# Patient Record
Sex: Female | Born: 1995 | Race: White | Hispanic: No | Marital: Married | Smoking: Former smoker
Health system: Southern US, Community
[De-identification: ages and names within clinical notes are randomized; demographics above are authoritative.]

## PROBLEM LIST (undated history)

## (undated) DIAGNOSIS — F32A Depression, unspecified: Secondary | ICD-10-CM

## (undated) DIAGNOSIS — F419 Anxiety disorder, unspecified: Secondary | ICD-10-CM

## (undated) DIAGNOSIS — Z789 Other specified health status: Secondary | ICD-10-CM

## (undated) DIAGNOSIS — K219 Gastro-esophageal reflux disease without esophagitis: Secondary | ICD-10-CM

## (undated) DIAGNOSIS — J45909 Unspecified asthma, uncomplicated: Secondary | ICD-10-CM

## (undated) DIAGNOSIS — F329 Major depressive disorder, single episode, unspecified: Secondary | ICD-10-CM

## (undated) DIAGNOSIS — F640 Transsexualism: Secondary | ICD-10-CM

## (undated) DIAGNOSIS — H8110 Benign paroxysmal vertigo, unspecified ear: Secondary | ICD-10-CM

## (undated) DIAGNOSIS — G43909 Migraine, unspecified, not intractable, without status migrainosus: Secondary | ICD-10-CM

## (undated) HISTORY — DX: Major depressive disorder, single episode, unspecified: F32.9

## (undated) HISTORY — DX: Transsexualism: F64.0

## (undated) HISTORY — PX: TONSILLECTOMY: SUR1361

## (undated) HISTORY — DX: Migraine, unspecified, not intractable, without status migrainosus: G43.909

## (undated) HISTORY — DX: Depression, unspecified: F32.A

## (undated) HISTORY — DX: Other specified health status: Z78.9

## (undated) HISTORY — DX: Benign paroxysmal vertigo, unspecified ear: H81.10

## (undated) HISTORY — DX: Anxiety disorder, unspecified: F41.9

## (undated) HISTORY — PX: ADENOIDECTOMY: SUR15

---

## 2015-05-20 ENCOUNTER — Encounter: Payer: Self-pay | Admitting: Endocrinology

## 2015-05-20 ENCOUNTER — Ambulatory Visit (INDEPENDENT_AMBULATORY_CARE_PROVIDER_SITE_OTHER): Payer: BLUE CROSS/BLUE SHIELD | Admitting: Endocrinology

## 2015-05-20 VITALS — BP 128/88 | HR 74 | Temp 97.7°F | Ht 71.5 in | Wt 259.0 lb

## 2015-05-20 DIAGNOSIS — R635 Abnormal weight gain: Secondary | ICD-10-CM | POA: Diagnosis not present

## 2015-05-20 DIAGNOSIS — Z789 Other specified health status: Secondary | ICD-10-CM | POA: Insufficient documentation

## 2015-05-20 DIAGNOSIS — F640 Transsexualism: Secondary | ICD-10-CM | POA: Diagnosis not present

## 2015-05-20 MED ORDER — ESTRADIOL 0.5 MG PO TABS: 0.5000 mg | ORAL_TABLET | Freq: Every day | ORAL | Status: DC

## 2015-05-20 MED ORDER — DEXAMETHASONE 1 MG PO TABS: ORAL_TABLET | ORAL | Status: DC

## 2015-05-20 NOTE — Progress Notes (Signed)
Subjective:    Patient ID: Samantha Watkins, female    DOB: April 27, 1995, 20 y.o.   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  she has been to counseling, but has not had medication for surgery for this.   She reports moderate weight gain, worst at the abdomen, and assoc depression.  She has recently regained her insurance.  She works as a Lawyer.   No h/o DVT.  No past medical history on file.  No past surgical history on file.  Social History   Social History  . Marital Status: Single    Spouse Name: N/A  . Number of Children: N/A  . Years of Education: N/A   Occupational History  . Not on file.   Social History Main Topics  . Smoking status: Never Smoker   . Smokeless tobacco: Not on file  . Alcohol Use: Not on file  . Drug Use: Not on file  . Sexual Activity: Not on file   Other Topics Concern  . Not on file   Social History Narrative  . No narrative on file    No current outpatient prescriptions on file prior to visit.   No current facility-administered medications on file prior to visit.    No Known Allergies  No family history on file.  BP 128/88 mmHg  Pulse 74  Temp(Src) 97.7 F (36.5 C) (Oral)  Ht 5' 11.5" (1.816 m)  Wt 259 lb (117.482 kg)  BMI 35.62 kg/m2  SpO2 97%   Review of Systems  Constitutional: Negative for fatigue.  HENT: Negative for dental problem.   Eyes: Negative for visual disturbance.  Respiratory: Negative for shortness of breath.   Cardiovascular: Negative for leg swelling.  Gastrointestinal: Negative for blood in stool.  Endocrine:       No gynecomastia  Genitourinary: Negative for hematuria.  Musculoskeletal: Positive for back pain.  Skin: Negative for rash.  Allergic/Immunologic: Negative for environmental allergies.  Neurological: Negative for headaches.  Hematological: Does not bruise/bleed easily.  Psychiatric/Behavioral: Positive for sleep disturbance.      Objective:   Physical Exam VS: see vs page GEN: no distress HEAD: head: no deformity eyes: no periorbital swelling, no proptosis.   external nose and ears are normal mouth: no lesion seen NECK: supple, thyroid is not enlarged CHEST WALL: no deformity LUNGS: clear to auscultation BREASTS:  bilat pseudogynecomastia.  CV: reg rate and rhythm, no murmur ABD: abdomen is soft, nontender.  no hepatosplenomegaly.  not distended.  no hernia GENITALIA:  Tanner 5 female.   MUSCULOSKELETAL: muscle bulk and strength are grossly normal.  no obvious joint swelling.  gait is normal and steady EXTEMITIES: no deformity.  no ulcer on the feet.  feet are of normal color and temp.  no edema PULSES: dorsalis pedis intact bilat.  no carotid bruit NEURO:  cn 2-12 grossly intact.   readily moves all 4's.  sensation is intact to touch on the feet SKIN:  Normal texture and temperature.  No rash or suspicious lesion is visible.  Normal female hair distribution.  Moderate acne on the back.   NODES:  None palpable at the neck PSYCH: alert, well-oriented.  Does not appear anxious nor depressed.  outside test results are reviewed:  TSH=normal.      Assessment & Plan:  transgender state, new to me  Patient is advised the following: Patient Instructions  i have sent a prescription to your pharmacy, for an estrogen  pill. Please come back for a follow-up appointment in 3 months. Please see psychology and urology specialists.  you will receive a phone call, about days and times for appointments.  you should do a "dexamethasone suppression test."  for this, you would take dexamethasone 1 mg at 9-10 pm, then come in for a "cortisol" blood test the next morning before 9 am.  you do not need to be fasting for this test.  The information below is for a woman.  For you, please pay attention to the part about blood clots, and call if you have leg swelling, pain, or cramps.    Estradiol tablets What is this medicine? ESTRADIOL (es tra  DYE ole) is an estrogen. It is mostly used as hormone replacement in menopausal women. It helps to treat hot flashes and prevent osteoporosis. It is also used to treat women with low estrogen levels or those who have had their ovaries removed. This medicine may be used for other purposes; ask your health care provider or pharmacist if you have questions. What should I tell my health care provider before I take this medicine? They need to know if you have or ever had any of these conditions: -blood vessel disease or blood clots -breast or liver cancer -dementia -diabetes -gallbladder disease -heart disease or recent heart attack -high blood pressure -high cholesterol -high level of calcium in the blood -kidney disease -liver disease -migraine headaches -protein C deficiency -protein S deficiency -stroke -systemic lupus erythematosus (SLE) -tobacco smoker -an unusual or allergic reaction to estrogens, other hormones, medicines, foods, dyes, or preservatives How should I use this medicine? Take this medicine by mouth. To reduce nausea, this medicine may be taken with food. Follow the directions on the prescription label. Take this medicine at the same time each day and in the order directed on the package. Do not take your medicine more often than directed. Contact your pediatrician regarding the use of this medicine in children. Special care may be needed. A patient package insert for the product will be given with each prescription and refill. Read this sheet carefully each time. The sheet may change frequently. Overdosage: If you think you have taken too much of this medicine contact a poison control center or emergency room at once. NOTE: This medicine is only for you. Do not share this medicine with others. What if I miss a dose? If you miss a dose, take it as soon as you can. If it is almost time for your next dose, take only that dose. Do not take double or extra doses. What may  interact with this medicine? Do not take this medicine with any of the following medications: -aromatase inhibitors like aminoglutethimide, anastrozole, exemestane, letrozole, testolactone This medicine may also interact with the following medications: -carbamazepine -certain antibiotics used to treat infections -certain barbiturates or benzodiazepines used for inducing sleep or treating seizures -grapefruit juice -medicines for fungus infections like itraconazole and ketoconazole -raloxifene or tamoxifen -rifabutin, rifampin, or rifapentine -ritonavir -St. John's Wort -warfarin This list may not describe all possible interactions. Give your health care provider a list of all the medicines, herbs, non-prescription drugs, or dietary supplements you use. Also tell them if you smoke, drink alcohol, or use illegal drugs. Some items may interact with your medicine. What should I watch for while using this medicine? Visit your doctor or health care professional for regular checks on your progress. You should also discuss the need for regular mammograms with your health care professional, and  follow his or her guidelines for these tests. This medicine can make your body retain fluid, making your fingers, hands, or ankles swell. Your blood pressure can go up. Contact your doctor or health care professional if you feel you are retaining fluid. If you have any reason to think you are pregnant, stop taking this medicine right away and contact your doctor or health care professional.  Smoking increases the risk of getting a blood clot or having a stroke while you are taking this medicine, especially if you are more than 20 years old. You are strongly advised not to smoke. If you wear contact lenses and notice visual changes, or if the lenses begin to feel uncomfortable, consult your eye doctor or health care professional. If you are going to have surgery, you may need to stop taking this medicine. Consult  your health care professional for advice before you schedule the surgery.  What side effects may I notice from receiving this medicine? Side effects that you should report to your doctor or health care professional as soon as possible: -allergic reactions like skin rash, itching or hives, swelling of the face, lips, or tongue -breast tissue changes or discharge -changes in vision -chest pain -confusion, trouble speaking or understanding -dark urine -general ill feeling or flu-like symptoms -light-colored stools -nausea, vomiting -pain, swelling, warmth in the leg -right upper belly pain -severe headaches -shortness of breath -sudden numbness or weakness of the face, arm or leg -trouble walking, dizziness, loss of balance or coordination -yellowing of the eyes or skin Side effects that usually do not require medical attention (report to your doctor or health care professional if they continue or are bothersome): -hair loss -increased hunger or thirst -increased urination -symptoms of vaginal infection like itching, irritation or unusual discharge -unusually weak or tired This list may not describe all possible side effects. Call your doctor for medical advice about side effects. You may report side effects to FDA at 1-800-FDA-1088. Where should I keep my medicine? Keep out of the reach of children. Store at room temperature between 20 and 25 degrees C (68 and 77 degrees F). Keep container tightly closed. Protect from light. Throw away any unused medicine after the expiration date. NOTE: This sheet is a summary. It may not cover all possible information. If you have questions about this medicine, talk to your doctor, pharmacist, or health care provider.    2016, Elsevier/Gold Standard. (2010-07-14 16:10:96)

## 2015-05-20 NOTE — Patient Instructions (Addendum)
i have sent a prescription to your pharmacy, for an estrogen pill. Please come back for a follow-up appointment in 3 months. Please see psychology and urology specialists.  you will receive a phone call, about days and times for appointments.  you should do a "dexamethasone suppression test."  for this, you would take dexamethasone 1 mg at 9-10 pm, then come in for a "cortisol" blood test the next morning before 9 am.  you do not need to be fasting for this test.  The information below is for a woman.  For you, please pay attention to the part about blood clots, and call if you have leg swelling, pain, or cramps.    Estradiol tablets What is this medicine? ESTRADIOL (es tra DYE ole) is an estrogen. It is mostly used as hormone replacement in menopausal women. It helps to treat hot flashes and prevent osteoporosis. It is also used to treat women with low estrogen levels or those who have had their ovaries removed. This medicine may be used for other purposes; ask your health care provider or pharmacist if you have questions. What should I tell my health care provider before I take this medicine? They need to know if you have or ever had any of these conditions: -blood vessel disease or blood clots -breast or liver cancer -dementia -diabetes -gallbladder disease -heart disease or recent heart attack -high blood pressure -high cholesterol -high level of calcium in the blood -kidney disease -liver disease -migraine headaches -protein C deficiency -protein S deficiency -stroke -systemic lupus erythematosus (SLE) -tobacco smoker -an unusual or allergic reaction to estrogens, other hormones, medicines, foods, dyes, or preservatives How should I use this medicine? Take this medicine by mouth. To reduce nausea, this medicine may be taken with food. Follow the directions on the prescription label. Take this medicine at the same time each day and in the order directed on the package. Do not  take your medicine more often than directed. Contact your pediatrician regarding the use of this medicine in children. Special care may be needed. A patient package insert for the product will be given with each prescription and refill. Read this sheet carefully each time. The sheet may change frequently. Overdosage: If you think you have taken too much of this medicine contact a poison control center or emergency room at once. NOTE: This medicine is only for you. Do not share this medicine with others. What if I miss a dose? If you miss a dose, take it as soon as you can. If it is almost time for your next dose, take only that dose. Do not take double or extra doses. What may interact with this medicine? Do not take this medicine with any of the following medications: -aromatase inhibitors like aminoglutethimide, anastrozole, exemestane, letrozole, testolactone This medicine may also interact with the following medications: -carbamazepine -certain antibiotics used to treat infections -certain barbiturates or benzodiazepines used for inducing sleep or treating seizures -grapefruit juice -medicines for fungus infections like itraconazole and ketoconazole -raloxifene or tamoxifen -rifabutin, rifampin, or rifapentine -ritonavir -St. John's Wort -warfarin This list may not describe all possible interactions. Give your health care provider a list of all the medicines, herbs, non-prescription drugs, or dietary supplements you use. Also tell them if you smoke, drink alcohol, or use illegal drugs. Some items may interact with your medicine. What should I watch for while using this medicine? Visit your doctor or health care professional for regular checks on your progress. You should also discuss the  need for regular mammograms with your health care professional, and follow his or her guidelines for these tests. This medicine can make your body retain fluid, making your fingers, hands, or ankles  swell. Your blood pressure can go up. Contact your doctor or health care professional if you feel you are retaining fluid. If you have any reason to think you are pregnant, stop taking this medicine right away and contact your doctor or health care professional.  Smoking increases the risk of getting a blood clot or having a stroke while you are taking this medicine, especially if you are more than 20 years old. You are strongly advised not to smoke. If you wear contact lenses and notice visual changes, or if the lenses begin to feel uncomfortable, consult your eye doctor or health care professional. If you are going to have surgery, you may need to stop taking this medicine. Consult your health care professional for advice before you schedule the surgery.  What side effects may I notice from receiving this medicine? Side effects that you should report to your doctor or health care professional as soon as possible: -allergic reactions like skin rash, itching or hives, swelling of the face, lips, or tongue -breast tissue changes or discharge -changes in vision -chest pain -confusion, trouble speaking or understanding -dark urine -general ill feeling or flu-like symptoms -light-colored stools -nausea, vomiting -pain, swelling, warmth in the leg -right upper belly pain -severe headaches -shortness of breath -sudden numbness or weakness of the face, arm or leg -trouble walking, dizziness, loss of balance or coordination -yellowing of the eyes or skin Side effects that usually do not require medical attention (report to your doctor or health care professional if they continue or are bothersome): -hair loss -increased hunger or thirst -increased urination -symptoms of vaginal infection like itching, irritation or unusual discharge -unusually weak or tired This list may not describe all possible side effects. Call your doctor for medical advice about side effects. You may report side effects to  FDA at 1-800-FDA-1088. Where should I keep my medicine? Keep out of the reach of children. Store at room temperature between 20 and 25 degrees C (68 and 77 degrees F). Keep container tightly closed. Protect from light. Throw away any unused medicine after the expiration date. NOTE: This sheet is a summary. It may not cover all possible information. If you have questions about this medicine, talk to your doctor, pharmacist, or health care provider.    2016, Elsevier/Gold Standard. (2010-07-14 16:10:96)

## 2015-05-21 ENCOUNTER — Other Ambulatory Visit (INDEPENDENT_AMBULATORY_CARE_PROVIDER_SITE_OTHER): Payer: BLUE CROSS/BLUE SHIELD

## 2015-05-21 DIAGNOSIS — R635 Abnormal weight gain: Secondary | ICD-10-CM

## 2015-05-21 LAB — CORTISOL: CORTISOL PLASMA: 0.6 ug/dL

## 2015-07-10 ENCOUNTER — Emergency Department (HOSPITAL_COMMUNITY): Payer: BLUE CROSS/BLUE SHIELD

## 2015-07-10 ENCOUNTER — Emergency Department (HOSPITAL_COMMUNITY)
Admission: EM | Admit: 2015-07-10 | Discharge: 2015-07-10 | Disposition: A | Discharge status: Home / Self Care | Payer: BLUE CROSS/BLUE SHIELD | Attending: Emergency Medicine | Admitting: Emergency Medicine

## 2015-07-10 ENCOUNTER — Encounter (HOSPITAL_COMMUNITY): Payer: Self-pay | Admitting: Emergency Medicine

## 2015-07-10 DIAGNOSIS — K219 Gastro-esophageal reflux disease without esophagitis: Secondary | ICD-10-CM | POA: Diagnosis not present

## 2015-07-10 DIAGNOSIS — R110 Nausea: Secondary | ICD-10-CM

## 2015-07-10 DIAGNOSIS — J45909 Unspecified asthma, uncomplicated: Secondary | ICD-10-CM | POA: Diagnosis not present

## 2015-07-10 DIAGNOSIS — Z79899 Other long term (current) drug therapy: Secondary | ICD-10-CM | POA: Diagnosis not present

## 2015-07-10 DIAGNOSIS — K5900 Constipation, unspecified: Secondary | ICD-10-CM

## 2015-07-10 DIAGNOSIS — R1013 Epigastric pain: Secondary | ICD-10-CM

## 2015-07-10 HISTORY — DX: Unspecified asthma, uncomplicated: J45.909

## 2015-07-10 HISTORY — DX: Gastro-esophageal reflux disease without esophagitis: K21.9

## 2015-07-10 LAB — COMPREHENSIVE METABOLIC PANEL
ALK PHOS: 93 U/L (ref 38–126)
ALT: 18 U/L (ref 17–63)
ANION GAP: 12 (ref 5–15)
AST: 18 U/L (ref 15–41)
Albumin: 5 g/dL (ref 3.5–5.0)
BILIRUBIN TOTAL: 0.4 mg/dL (ref 0.3–1.2)
BUN: 10 mg/dL (ref 6–20)
CALCIUM: 10.2 mg/dL (ref 8.9–10.3)
CO2: 27 mmol/L (ref 22–32)
CREATININE: 0.78 mg/dL (ref 0.61–1.24)
Chloride: 105 mmol/L (ref 101–111)
Glucose, Bld: 92 mg/dL (ref 65–99)
Potassium: 4.1 mmol/L (ref 3.5–5.1)
Sodium: 144 mmol/L (ref 135–145)
TOTAL PROTEIN: 8.4 g/dL — AB (ref 6.5–8.1)

## 2015-07-10 LAB — CBC
HCT: 45.7 % (ref 39.0–52.0)
HEMOGLOBIN: 15.5 g/dL (ref 13.0–17.0)
MCH: 27.8 pg (ref 26.0–34.0)
MCHC: 33.9 g/dL (ref 30.0–36.0)
MCV: 81.9 fL (ref 78.0–100.0)
PLATELETS: 353 10*3/uL (ref 150–400)
RBC: 5.58 MIL/uL (ref 4.22–5.81)
RDW: 12.6 % (ref 11.5–15.5)
WBC: 9.5 10*3/uL (ref 4.0–10.5)

## 2015-07-10 LAB — LIPASE, BLOOD: Lipase: 25 U/L (ref 11–51)

## 2015-07-10 LAB — URINALYSIS, ROUTINE W REFLEX MICROSCOPIC
BILIRUBIN URINE: NEGATIVE
Glucose, UA: NEGATIVE mg/dL
Hgb urine dipstick: NEGATIVE
Ketones, ur: NEGATIVE mg/dL
Leukocytes, UA: NEGATIVE
NITRITE: NEGATIVE
PROTEIN: NEGATIVE mg/dL
SPECIFIC GRAVITY, URINE: 1.027 (ref 1.005–1.030)
pH: 6 (ref 5.0–8.0)

## 2015-07-10 MED ORDER — POLYETHYLENE GLYCOL 3350 17 GM/SCOOP PO POWD: 17.0000 g | Freq: Two times a day (BID) | ORAL | Status: AC

## 2015-07-10 MED ORDER — OMEPRAZOLE 20 MG PO CPDR: 20.0000 mg | DELAYED_RELEASE_CAPSULE | Freq: Every day | ORAL | Status: AC

## 2015-07-10 MED ORDER — ONDANSETRON 8 MG PO TBDP
8.0000 mg | ORAL_TABLET | Freq: Once | ORAL | Status: AC
  Administered 2015-07-10: 8 mg via ORAL
  Filled 2015-07-10: qty 1

## 2015-07-10 MED ORDER — PANTOPRAZOLE SODIUM 40 MG PO TBEC
40.0000 mg | DELAYED_RELEASE_TABLET | Freq: Once | ORAL | Status: AC
  Administered 2015-07-10: 40 mg via ORAL
  Filled 2015-07-10: qty 1

## 2015-07-10 MED ORDER — KETOROLAC TROMETHAMINE 60 MG/2ML IM SOLN
60.0000 mg | Freq: Once | INTRAMUSCULAR | Status: AC
  Administered 2015-07-10: 60 mg via INTRAMUSCULAR
  Filled 2015-07-10: qty 2

## 2015-07-10 MED ORDER — ONDANSETRON 8 MG PO TBDP: ORAL_TABLET | ORAL | Status: AC

## 2015-07-10 NOTE — ED Provider Notes (Signed)
CSN: [id number]     Arrival date & time 07/10/15  1251 History   First MD Initiated Contact with Patient 07/10/15 1714     Chief Complaint  Patient presents with  . Abdominal Pain  . Constipation  . Nausea     (Consider location/radiation/quality/duration/timing/severity/associated sxs/prior Treatment) The history is provided by the patient and medical records. No language interpreter was used.     Samantha Watkins is a 20 y.o. female  Female to female transition who prefers to be called [name] presents with history of asthma and GERD presents to the Emergency Department complaining of gradual, persistent, progressively worsening nausea onset 6 days ago.  Associated symptoms include epigastric abd pain, LUQ abd pain, nausea and self induced vomiting 5 days ago.   Pt was evaluated by an ED in [facility] and dx with constipation.  She was treated with several medications for constipation, producing several bowel movements, but did not continue any medications for constipation.  Pt reports her symptoms improved but the nausea persists.  She reports 2 days ago he developed a return of the epigastric and left upper quadrant abdominal pain. Pt reports the persistent symptoms come in waves, waxing and waning but never resolving.  Pt reports she has been taking Zofran with improvement but with persistent pain.  Pt reports eating makes the pain worse and therefore has had decreased appetite..  No EtOH, smoking or illicit drug usage.  Pt reports taking Estradiol on Jan 24th.  Pt reports daily bowel movements that have been small and hard.   Past Medical History  Diagnosis Date  . Asthma     childhood  . GERD (gastroesophageal reflux disease)    Past Surgical History  Procedure Laterality Date  . Tonsillectomy    . Adenoidectomy     No family history on file. Social History  Substance Use Topics  . Smoking status: Never Smoker   . Smokeless tobacco: None  . Alcohol Use: No    Review of Systems   Constitutional: Negative for fever, diaphoresis, appetite change, fatigue and unexpected weight change.  HENT: Negative for mouth sores.   Eyes: Negative for visual disturbance.  Respiratory: Negative for cough, chest tightness, shortness of breath and wheezing.   Cardiovascular: Negative for chest pain.  Gastrointestinal: Positive for nausea, vomiting ( resolved), abdominal pain and constipation. Negative for diarrhea.  Endocrine: Negative for polydipsia, polyphagia and polyuria.  Genitourinary: Negative for dysuria, urgency, frequency and hematuria.  Musculoskeletal: Negative for back pain and neck stiffness.  Skin: Negative for rash.  Allergic/Immunologic: Negative for immunocompromised state.  Neurological: Negative for syncope, light-headedness and headaches.  Hematological: Does not bruise/bleed easily.  Psychiatric/Behavioral: Negative for sleep disturbance. The patient is not nervous/anxious.       Allergies  Review of patient's allergies indicates no known allergies.  Home Medications   Prior to Admission medications   Medication Sig Start Date End Date Taking? Authorizing Provider  estradiol (ESTRACE) 0.5 MG tablet Take 1 tablet (0.5 mg total) by mouth daily. 05/20/15  Yes Romero Belling, MD  ondansetron (ZOFRAN) 4 MG tablet Take 4 mg by mouth every 8 (eight) hours as needed for nausea.  07/08/15  Yes Historical Provider, MD  dexamethasone (DECADRON) 1 MG tablet Take once, at 9-10 pm, the night before blood test Patient not taking: Reported on 07/10/2015 05/20/15   Romero Belling, MD  omeprazole (PRILOSEC) 20 MG capsule Take 1 capsule (20 mg total) by mouth daily. 07/10/15   Rees Santistevan, PA-C  ondansetron [facility]  ODT) 8 MG disintegrating tablet  ODT q4 hours prn nausea 07/10/15   Juandaniel Manfredo, PA-C  polyethylene glycol powder (GLYCOLAX/MIRALAX) powder Take 17 g by mouth 2 (two) times daily. 07/10/15   Isael Stille, PA-C   BP 116/99 mmHg  Pulse 97   Temp(Src) 98.1 F (36.7 C) (Oral)  Resp 16  SpO2 100% Physical Exam  Constitutional: He appears well-developed and well-nourished. No distress.  Awake, alert, nontoxic appearance  HENT:  Head: Normocephalic and atraumatic.  Mouth/Throat: Oropharynx is clear and moist. No oropharyngeal exudate.  Eyes: Conjunctivae are normal. No scleral icterus.  Neck: Normal range of motion. Neck supple.  Cardiovascular: Normal rate, regular rhythm, normal heart sounds and intact distal pulses.   Pulmonary/Chest: Effort normal and breath sounds normal. No respiratory distress. He has no wheezes.  Equal chest expansion  Abdominal: Soft. Bowel sounds are normal. He exhibits no mass. There is no tenderness. There is no rebound and no guarding. Hernia confirmed negative in the right inguinal area and confirmed negative in the left inguinal area.  Genitourinary: Testes normal and penis normal. Right testis shows no mass, no swelling and no tenderness. Right testis is descended. Cremasteric reflex is not absent on the right side. Left testis shows no mass, no swelling and no tenderness. Left testis is descended. Cremasteric reflex is not absent on the left side. Circumcised. No phimosis, paraphimosis, hypospadias, penile erythema or penile tenderness. No discharge found.  Musculoskeletal: Normal range of motion. He exhibits no edema.  Lymphadenopathy:       Right: No inguinal adenopathy present.       Left: No inguinal adenopathy present.  Neurological: He is alert.  Speech is clear and goal oriented Moves extremities without ataxia  Skin: Skin is warm and dry. He is not diaphoretic.  Psychiatric: He has a normal mood and affect.  Nursing note and vitals reviewed.   ED Course  Procedures (including critical care time) Labs Review Labs Reviewed  COMPREHENSIVE METABOLIC PANEL - Abnormal; Notable for the following:    Total Protein 8.4 (*)    All other components within normal limits  LIPASE, BLOOD  CBC   URINALYSIS, ROUTINE W REFLEX MICROSCOPIC (NOT AT [facility])    Imaging Review Dg Abd Acute W/chest  07/10/2015  CLINICAL DATA:  Abdominal pain for 2 weeks EXAM: DG ABDOMEN ACUTE W/ 1V CHEST COMPARISON:  None. FINDINGS: There is no evidence of dilated bowel loops or free intraperitoneal air. No radiopaque calculi or other significant radiographic abnormality is seen. Heart size and mediastinal contours are within normal limits. Both lungs are clear. IMPRESSION: Negative abdominal radiographs.  No acute cardiopulmonary disease. Electronically Signed   By: Alcide Clever M.D.   On: 07/10/2015 18:19   I have personally reviewed and evaluated these images and lab results as part of my medical decision-making.   MDM   Final diagnoses:  Constipation, unspecified constipation type  Gastroesophageal reflux disease, esophagitis presence not specified  Epigastric abdominal pain  Nausea   Samantha Watkins presents with epigastric and LUQ abd pain.  Labs reassuring.  Acute abdominal series with large stool burden noted. On exam patient abdomen is soft and nontender without rebound or guarding. Suspect patient with persistent constipation due to estradiol usage.  Patient also with history of GERD. Given Protonix here in the emergency department. We'll discharge home with additional Zofran and omeprazole.  Discussed regular use of MiraLAX for decreased constipation.    Patient is nontoxic, nonseptic appearing, in no apparent distress.  Patient's  pain and other symptoms adequately managed in emergency department.  Labs, imaging and vitals reviewed.  Patient does not meet the SIRS or Sepsis criteria.  On repeat exam patient does not have a surgical abdomin and there are no peritoneal signs.  No indication of appendicitis, bowel obstruction, bowel perforation, cholecystitis, diverticulitis.  Patient given strict instructions for follow-up with their primary care physician.  I have also discussed reasons to return  immediately to the ER.  Patient expresses understanding and agrees with plan.       Dahlia ClientHannah Tiona Ruane, PA-C 07/11/15 0144  Vanetta MuldersScott Zackowski, MD 07/11/15 [id number]

## 2015-07-10 NOTE — Discharge Instructions (Signed)
1. Medications: zofran, MIralax, omeprazole, usual home medications 2. Treatment: rest, drink plenty of fluids, advance diet slowly; avoid constipating foods such as bananas, rice, breads etc. 3. Follow Up: Please followup with your primary doctor in 2 days for discussion of your diagnoses and further evaluation after today's visit; if you do not have a primary care doctor use the resource guide provided to find one; Please return to the ER for persistent vomiting, high fevers or worsening symptoms   Constipation, Adult Constipation is when a person has fewer than three bowel movements a week, has difficulty having a bowel movement, or has stools that are dry, hard, or larger than normal. As people grow older, constipation is more common. A low-fiber diet, not taking in enough fluids, and taking certain medicines may make constipation worse.  CAUSES   Certain medicines, such as antidepressants, pain medicine, iron supplements, antacids, and water pills.   Certain diseases, such as diabetes, irritable bowel syndrome (IBS), thyroid disease, or depression.   Not drinking enough water.   Not eating enough fiber-rich foods.   Stress or travel.   Lack of physical activity or exercise.   Ignoring the urge to have a bowel movement.   Using laxatives too much.  SIGNS AND SYMPTOMS   Having fewer than three bowel movements a week.   Straining to have a bowel movement.   Having stools that are hard, dry, or larger than normal.   Feeling full or bloated.   Pain in the lower abdomen.   Not feeling relief after having a bowel movement.  DIAGNOSIS  Your health care provider will take a medical history and perform a physical exam. Further testing may be done for severe constipation. Some tests may include:  A barium enema X-ray to examine your rectum, colon, and, sometimes, your small intestine.   A sigmoidoscopy to examine your lower colon.   A colonoscopy to examine your  entire colon. TREATMENT  Treatment will depend on the severity of your constipation and what is causing it. Some dietary treatments include drinking more fluids and eating more fiber-rich foods. Lifestyle treatments may include regular exercise. If these diet and lifestyle recommendations do not help, your health care provider may recommend taking over-the-counter laxative medicines to help you have bowel movements. Prescription medicines may be prescribed if over-the-counter medicines do not work.  HOME CARE INSTRUCTIONS   Eat foods that have a lot of fiber, such as fruits, vegetables, whole grains, and beans.  Limit foods high in fat and processed sugars, such as french fries, hamburgers, cookies, candies, and soda.   A fiber supplement may be added to your diet if you cannot get enough fiber from foods.   Drink enough fluids to keep your urine clear or pale yellow.   Exercise regularly or as directed by your health care provider.   Go to the restroom when you have the urge to go. Do not hold it.   Only take over-the-counter or prescription medicines as directed by your health care provider. Do not take other medicines for constipation without talking to your health care provider first.  SEEK IMMEDIATE MEDICAL CARE IF:   You have bright red blood in your stool.   Your constipation lasts for more than 4 days or gets worse.   You have abdominal or rectal pain.   You have thin, pencil-like stools.   You have unexplained weight loss. MAKE SURE YOU:   Understand these instructions.  Will watch your condition.  Will get  help right away if you are not doing well or get worse.   This information is not intended to replace advice given to you by your health care provider. Make sure you discuss any questions you have with your health care provider.   Document Released: 01/08/2004 Document Revised: 05/02/2014 Document Reviewed: 01/21/2013 Elsevier Interactive Patient  Education [employer]2016 Elsevier Inc.

## 2015-07-10 NOTE — ED Notes (Signed)
Pt from home c/o abd pain, constipation and nausea x2 weeks. Pt has been seen at 2 facilities and was given Zofran for nausea and was told that is symptoms did not resolve to go to the ED. Pt reports that he had a BM this am that was normal but still has epigastric pain and LUQ pain. Pt denies emesis but still c/o emesis. Pt is A&O and in NAD

## 2015-08-18 ENCOUNTER — Encounter: Payer: Self-pay | Admitting: Endocrinology

## 2015-08-18 ENCOUNTER — Ambulatory Visit (INDEPENDENT_AMBULATORY_CARE_PROVIDER_SITE_OTHER): Payer: BLUE CROSS/BLUE SHIELD | Admitting: Endocrinology

## 2015-08-18 VITALS — BP 122/84 | HR 69 | Temp 98.4°F | Ht 71.5 in | Wt 245.0 lb

## 2015-08-18 DIAGNOSIS — F640 Transsexualism: Secondary | ICD-10-CM | POA: Diagnosis not present

## 2015-08-18 DIAGNOSIS — Z789 Other specified health status: Secondary | ICD-10-CM

## 2015-08-18 NOTE — Patient Instructions (Addendum)
blood tests are requested for you today.  We'll let you know about the results. Based on the results, we'll probably need to increase the estrogen pill. Please come back for a follow-up appointment in 4 months. Please call for an appointment to go back to the psychology specialist.   Please call if you have leg swelling, pain, or cramps.

## 2015-08-18 NOTE — Progress Notes (Signed)
   Subjective:    Patient ID: Samantha Watkins, female    DOB: 02/17/1996, 20 y.o.   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  Since on the estrace, she feels no different.  Specifically, she has noted little if any breast growth.   Past Medical History  Diagnosis Date  . Asthma     childhood  . GERD (gastroesophageal reflux disease)     Past Surgical History  Procedure Laterality Date  . Tonsillectomy    . Adenoidectomy      Social History   Social History  . Marital Status: Single    Spouse Name: N/A  . Number of Children: N/A  . Years of Education: N/A   Occupational History  . Not on file.   Social History Main Topics  . Smoking status: Never Smoker   . Smokeless tobacco: Not on file  . Alcohol Use: No  . Drug Use: No  . Sexual Activity: Not on file   Other Topics Concern  . Not on file   Social History Narrative    Current Outpatient Prescriptions on File Prior to Visit  Medication Sig Dispense Refill  . omeprazole (PRILOSEC) 20 MG capsule Take 1 capsule (20 mg total) by mouth daily. 30 capsule 0  . ondansetron (ZOFRAN ODT) 8 MG disintegrating tablet 8mg  ODT q4 hours prn nausea (Patient not taking: Reported on 08/18/2015) 4 tablet 0  . ondansetron (ZOFRAN) 4 MG tablet Take 4 mg by mouth every 8 (eight) hours as needed for nausea. Reported on 08/18/2015  0  . polyethylene glycol powder (GLYCOLAX/MIRALAX) powder Take 17 g by mouth 2 (two) times daily. 255 g 0   No current facility-administered medications on file prior to visit.    No Known Allergies  No family history on file.  BP 122/84 mmHg  Pulse 69  Temp(Src) 98.4 F (36.9 C) (Oral)  Ht 5' 11.5" (1.816 m)  Wt 245 lb (111.131 kg)  BMI 33.70 kg/m2  SpO2  99%  Review of Systems Edema.      Objective:   Physical Exam VITAL SIGNS:  See vs page. GENERAL: no distress.  Ext: no edema.     Lab Results  Component Value Date   TESTOSTERONE 179 08/18/2015      Assessment & Plan:  transgender state: she needs increased rx in order to suppress testosterone, as she declines orchiectomy.     Patient is advised the following: Patient Instructions  blood tests are requested for you today.  We'll let you know about the results. Based on the results, we'll probably need to increase the estrogen pill. Please come back for a follow-up appointment in 4 months. Please call for an appointment to go back to the psychology specialist.   Please call if you have leg swelling, pain, or cramps.   addendum:

## 2015-08-19 LAB — TESTOSTERONE,FREE AND TOTAL
TESTOSTERONE: 179 ng/dL
Testosterone, Free: 7.5 pg/mL

## 2015-08-19 MED ORDER — ESTRADIOL 1 MG PO TABS: 1.0000 mg | ORAL_TABLET | Freq: Every day | ORAL | Status: DC

## 2015-09-07 ENCOUNTER — Other Ambulatory Visit: Payer: Self-pay | Admitting: Family Medicine

## 2015-09-07 DIAGNOSIS — R1013 Epigastric pain: Secondary | ICD-10-CM

## 2015-09-07 DIAGNOSIS — R110 Nausea: Secondary | ICD-10-CM

## 2015-09-08 ENCOUNTER — Ambulatory Visit
Admission: RE | Admit: 2015-09-08 | Discharge: 2015-09-08 | Disposition: A | Discharge status: Home / Self Care | Payer: BLUE CROSS/BLUE SHIELD | Source: Ambulatory Visit | Attending: Family Medicine | Admitting: Family Medicine

## 2015-09-08 DIAGNOSIS — R110 Nausea: Secondary | ICD-10-CM

## 2015-09-08 DIAGNOSIS — R1013 Epigastric pain: Secondary | ICD-10-CM

## 2015-12-18 ENCOUNTER — Ambulatory Visit: Payer: BLUE CROSS/BLUE SHIELD | Admitting: Endocrinology

## 2015-12-21 ENCOUNTER — Other Ambulatory Visit (HOSPITAL_COMMUNITY): Payer: Self-pay | Admitting: Family Medicine

## 2015-12-21 DIAGNOSIS — R110 Nausea: Secondary | ICD-10-CM

## 2015-12-29 ENCOUNTER — Encounter (HOSPITAL_COMMUNITY)
Admission: RE | Admit: 2015-12-29 | Discharge: 2015-12-29 | Disposition: A | Discharge status: Home / Self Care | Payer: BLUE CROSS/BLUE SHIELD | Source: Ambulatory Visit | Attending: Family Medicine | Admitting: Family Medicine

## 2015-12-29 ENCOUNTER — Encounter (HOSPITAL_COMMUNITY): Payer: Self-pay | Admitting: Radiology

## 2015-12-29 DIAGNOSIS — R110 Nausea: Secondary | ICD-10-CM | POA: Diagnosis present

## 2015-12-29 MED ORDER — TECHNETIUM TC 99M MEBROFENIN IV KIT: 5.0000 | PACK | Freq: Once | INTRAVENOUS | Status: DC | PRN

## 2016-01-07 ENCOUNTER — Ambulatory Visit (INDEPENDENT_AMBULATORY_CARE_PROVIDER_SITE_OTHER): Payer: BLUE CROSS/BLUE SHIELD | Admitting: Endocrinology

## 2016-01-07 ENCOUNTER — Other Ambulatory Visit: Payer: BLUE CROSS/BLUE SHIELD

## 2016-01-07 VITALS — BP 136/87 | HR 83 | Ht 72.0 in | Wt 250.0 lb

## 2016-01-07 DIAGNOSIS — Z789 Other specified health status: Secondary | ICD-10-CM

## 2016-01-07 DIAGNOSIS — F640 Transsexualism: Secondary | ICD-10-CM

## 2016-01-07 MED ORDER — ESTRADIOL 2 MG PO TABS: 2.0000 mg | ORAL_TABLET | Freq: Every day | ORAL | 3 refills | Status: DC

## 2016-01-07 NOTE — Progress Notes (Signed)
   Subjective:    Patient ID: Samantha Watkins, female    DOB: 09/09/1995, 20 y.o.   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  Specifically, she has noted only a little breast growth.  Past Medical History:  Diagnosis Date  . Asthma    childhood  . GERD (gastroesophageal reflux disease)     Past Surgical History:  Procedure Laterality Date  . ADENOIDECTOMY    . TONSILLECTOMY      Social History   Social History  . Marital status: Married    Spouse name: N/A  . Number of children: N/A  . Years of education: N/A   Occupational History  . Not on file.   Social History Main Topics  . Smoking status: Never Smoker  . Smokeless tobacco: Not on file  . Alcohol use No  . Drug use: No  . Sexual activity: Not on file   Other Topics Concern  . Not on file   Social History Narrative  . No narrative on file    Current Outpatient Prescriptions on File Prior to Visit  Medication Sig Dispense Refill  . omeprazole (PRILOSEC) 20 MG capsule Take 1 capsule (20 mg total) by mouth daily. (Patient taking differently: Take 20 mg by mouth 2 (two) times daily before a meal. ) 30 capsule 0  . ondansetron (ZOFRAN ODT) 8 MG disintegrating tablet 8mg  ODT q4 hours prn nausea 4 tablet 0  . polyethylene glycol powder (GLYCOLAX/MIRALAX) powder Take 17 g by mouth 2 (two) times daily. 255 g 0   No current facility-administered medications on file prior to visit.     No Known Allergies  No family history on file.  BP 136/87   Pulse 83   Ht 6' (1.829 m)   Wt 250 lb (113.4 kg)   SpO2 98%   BMI 33.91 kg/m   Review of Systems Denies edema.     Objective:   Physical Exam VITAL SIGNS:  See vs page GENERAL: no distress Ext: no edema.  Lab  Results  Component Value Date   TESTOSTERONE 190 (L) 01/07/2016      Assessment & Plan:  Transgender state: I advised orchiectomy, but she declines, at least for now.

## 2016-01-07 NOTE — Patient Instructions (Addendum)
blood tests are requested for you today.  We'll let you know about the results. I have sent a prescription to your pharmacy, to increase the estrogen pill. Please come back for a follow-up appointment in 4 months.  Please call if you have leg swelling, pain, or cramps.

## 2016-01-09 LAB — TESTOSTERONE,FREE AND TOTAL
TESTOSTERONE: 190 ng/dL — AB (ref 264–916)
Testosterone, Free: 4.8 pg/mL

## 2016-01-11 ENCOUNTER — Telehealth: Payer: Self-pay | Admitting: Endocrinology

## 2016-01-11 MED ORDER — FINASTERIDE 1 MG PO TABS: 1.0000 mg | ORAL_TABLET | Freq: Every day | ORAL | 11 refills | Status: DC

## 2016-01-11 NOTE — Telephone Encounter (Signed)
Pt is asking if she could add a testosterone blocker to her regimine

## 2016-01-11 NOTE — Telephone Encounter (Signed)
Attempted to reach the patient, patient was unavailable and did not have a working Recruitment consultantvoice mail. Will try again at a later time.

## 2016-01-11 NOTE — Telephone Encounter (Signed)
PT called to get results from the testosterone labs, also wanted to ask a question about a medication (I think, phone was breaking up) requests call back.

## 2016-01-11 NOTE — Telephone Encounter (Signed)
Ok, I have sent a prescription to your pharmacy 

## 2016-01-11 NOTE — Telephone Encounter (Signed)
See message and please advise, Thanks!  

## 2016-01-11 NOTE — Telephone Encounter (Signed)
Attempted to reach the patient. Patient was unavailable and did not have a working voicemail. Will try again at a later time.  

## 2016-01-12 ENCOUNTER — Other Ambulatory Visit: Payer: Self-pay | Admitting: Endocrinology

## 2016-01-12 MED ORDER — FINASTERIDE 5 MG PO TABS: 2.5000 mg | ORAL_TABLET | Freq: Every day | ORAL | 5 refills | Status: DC

## 2016-01-12 NOTE — Telephone Encounter (Signed)
See message and please advise, Thanks!  

## 2016-01-12 NOTE — Telephone Encounter (Signed)
A 5 mg is $9 per 30 days at [facility].  Do you want to start with 1/2 pill per day?

## 2016-01-12 NOTE — Telephone Encounter (Signed)
I contacted the patient and advised of message via voicemail. Requested a call back if the patient would like to discuss.  

## 2016-01-12 NOTE — Telephone Encounter (Signed)
The propecia is not covered at all is there an alternate?

## 2016-01-12 NOTE — Telephone Encounter (Signed)
Ok, I have sent

## 2016-01-12 NOTE — Telephone Encounter (Signed)
I contacted the patient and advised of message. Patient agreed to having the medciation sent to the [facility] [address].

## 2016-01-15 ENCOUNTER — Other Ambulatory Visit: Payer: Self-pay | Admitting: Endocrinology

## 2016-01-15 ENCOUNTER — Encounter: Payer: Self-pay | Admitting: Endocrinology

## 2016-01-15 DIAGNOSIS — F640 Transsexualism: Secondary | ICD-10-CM

## 2016-01-15 DIAGNOSIS — Z789 Other specified health status: Secondary | ICD-10-CM

## 2016-02-23 ENCOUNTER — Other Ambulatory Visit (HOSPITAL_COMMUNITY): Payer: Self-pay | Admitting: Physician Assistant

## 2016-02-23 DIAGNOSIS — R110 Nausea: Secondary | ICD-10-CM

## 2016-02-23 DIAGNOSIS — R1013 Epigastric pain: Secondary | ICD-10-CM

## 2016-02-25 ENCOUNTER — Emergency Department (HOSPITAL_COMMUNITY)
Admission: EM | Admit: 2016-02-25 | Discharge: 2016-02-26 | Disposition: A | Discharge status: Home / Self Care | Payer: BLUE CROSS/BLUE SHIELD | Attending: Emergency Medicine | Admitting: Emergency Medicine

## 2016-02-25 ENCOUNTER — Emergency Department (HOSPITAL_COMMUNITY): Payer: BLUE CROSS/BLUE SHIELD

## 2016-02-25 ENCOUNTER — Encounter (HOSPITAL_COMMUNITY): Payer: Self-pay | Admitting: Emergency Medicine

## 2016-02-25 DIAGNOSIS — R42 Dizziness and giddiness: Secondary | ICD-10-CM | POA: Insufficient documentation

## 2016-02-25 DIAGNOSIS — G43809 Other migraine, not intractable, without status migrainosus: Secondary | ICD-10-CM | POA: Insufficient documentation

## 2016-02-25 DIAGNOSIS — J45909 Unspecified asthma, uncomplicated: Secondary | ICD-10-CM | POA: Insufficient documentation

## 2016-02-25 DIAGNOSIS — R51 Headache: Secondary | ICD-10-CM | POA: Diagnosis present

## 2016-02-25 LAB — URINALYSIS, ROUTINE W REFLEX MICROSCOPIC
BILIRUBIN URINE: NEGATIVE
Glucose, UA: NEGATIVE mg/dL
HGB URINE DIPSTICK: NEGATIVE
Ketones, ur: NEGATIVE mg/dL
Leukocytes, UA: NEGATIVE
Nitrite: NEGATIVE
PROTEIN: NEGATIVE mg/dL
Specific Gravity, Urine: 1.006 (ref 1.005–1.030)
pH: 6 (ref 5.0–8.0)

## 2016-02-25 LAB — CBC
HCT: 40.9 % (ref 39.0–52.0)
Hemoglobin: 14 g/dL (ref 13.0–17.0)
MCH: 28 pg (ref 26.0–34.0)
MCHC: 34.2 g/dL (ref 30.0–36.0)
MCV: 81.8 fL (ref 78.0–100.0)
PLATELETS: 320 10*3/uL (ref 150–400)
RBC: 5 MIL/uL (ref 4.22–5.81)
RDW: 12.6 % (ref 11.5–15.5)
WBC: 13 10*3/uL — ABNORMAL HIGH (ref 4.0–10.5)

## 2016-02-25 LAB — COMPREHENSIVE METABOLIC PANEL
ALK PHOS: 70 U/L (ref 38–126)
ALT: 24 U/L (ref 17–63)
AST: 30 U/L (ref 15–41)
Albumin: 4.6 g/dL (ref 3.5–5.0)
Anion gap: 8 (ref 5–15)
BILIRUBIN TOTAL: 0.6 mg/dL (ref 0.3–1.2)
BUN: 11 mg/dL (ref 6–20)
CALCIUM: 9.2 mg/dL (ref 8.9–10.3)
CO2: 25 mmol/L (ref 22–32)
CREATININE: 0.86 mg/dL (ref 0.61–1.24)
Chloride: 108 mmol/L (ref 101–111)
GFR calc non Af Amer: >60 mL/min (ref >60)
GLUCOSE: 123 mg/dL — AB (ref 65–99)
Potassium: 3.8 mmol/L (ref 3.5–5.1)
SODIUM: 141 mmol/L (ref 135–145)
Total Protein: 8 g/dL (ref 6.5–8.1)

## 2016-02-25 MED ORDER — METOCLOPRAMIDE HCL 5 MG/ML IJ SOLN
10.0000 mg | Freq: Once | INTRAMUSCULAR | Status: AC
  Administered 2016-02-25: 10 mg via INTRAVENOUS
  Filled 2016-02-25: qty 2

## 2016-02-25 MED ORDER — MECLIZINE HCL 25 MG PO TABS
25.0000 mg | ORAL_TABLET | Freq: Three times a day (TID) | ORAL | 0 refills | Status: AC | PRN
Start: 2016-02-25 — End: ?

## 2016-02-25 MED ORDER — MECLIZINE HCL 25 MG PO TABS
25.0000 mg | ORAL_TABLET | Freq: Once | ORAL | Status: AC
  Administered 2016-02-25: 25 mg via ORAL
  Filled 2016-02-25: qty 1

## 2016-02-25 MED ORDER — SODIUM CHLORIDE 0.9 % IV BOLUS (SEPSIS)
1000.0000 mL | Freq: Once | INTRAVENOUS | Status: AC
  Administered 2016-02-25: 1000 mL via INTRAVENOUS

## 2016-02-25 MED ORDER — KETOROLAC TROMETHAMINE 30 MG/ML IJ SOLN
30.0000 mg | Freq: Once | INTRAMUSCULAR | Status: AC
  Administered 2016-02-25: 30 mg via INTRAVENOUS
  Filled 2016-02-25: qty 1

## 2016-02-25 MED ORDER — DIPHENHYDRAMINE HCL 50 MG/ML IJ SOLN
25.0000 mg | Freq: Once | INTRAMUSCULAR | Status: AC
  Administered 2016-02-25: 25 mg via INTRAVENOUS
  Filled 2016-02-25: qty 1

## 2016-02-25 NOTE — ED Notes (Signed)
Took 8 mg of zofran at home around 2000 with no relief.

## 2016-02-25 NOTE — ED Provider Notes (Signed)
[facility]-EMERGENCY DEPT Provider Note   CSN: [id number] Arrival date & time: 02/25/16  1950     History   Chief Complaint Chief Complaint  Patient presents with  . Dizziness  . Diarrhea    HPI Samantha Watkins is a 20 y.o. 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  Chief complaint of dizziness and headache. This migraine headache the last 2 days. States her at a [employer] of tear". She states/and light seem to bother her eyes and headache. Tonight with sitting up she felt some nausea and several episodes of emesis and a spinning sensation in her head. His never experienced vertigo before. No difficulty with speech or use of extremities. Symptoms have improved but not resolved.  HPI  Past Medical History:  Diagnosis Date  . Asthma    childhood  . GERD (gastroesophageal reflux disease)     Patient Active Problem List   Diagnosis Date Noted  . 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 Weight gain 05/20/2015    Past Surgical History:  Procedure Laterality Date  . ADENOIDECTOMY    . TONSILLECTOMY      OB History    No data available       Home Medications    Prior to Admission medications   Medication Sig Start Date End Date Taking? Authorizing Provider  estradiol (ESTRACE) 2 MG tablet Take 1 tablet (2 mg total) by mouth daily. 01/07/16  Yes Romero Belling, MD  finasteride (PROSCAR) 5 MG tablet Take 0.5 tablets (2.5 mg total) by mouth daily. 01/12/16  Yes Romero Belling, MD  omeprazole (PRILOSEC) 20 MG capsule Take 1 capsule (20 mg total) by mouth daily. 07/10/15  Yes Hannah Muthersbaugh, PA-C  ondansetron (ZOFRAN ODT) 8 MG disintegrating tablet 8mg  ODT q4 hours prn nausea 07/10/15  Yes Hannah Muthersbaugh, PA-C  polyethylene glycol powder (GLYCOLAX/MIRALAX) powder Take 17 g by mouth 2 (two) times daily. Patient taking differently: Take 17 g by mouth daily.  07/10/15  Yes Hannah Muthersbaugh, PA-C  simethicone (MYLICON) 80 MG chewable tablet Chew 80 mg by mouth  every 6 (six) hours as needed for flatulence.   Yes Historical Provider, MD  finasteride (PROPECIA) 1 MG tablet Take 1 tablet (1 mg total) by mouth daily. Patient not taking: Reported on 02/25/2016 01/11/16   Romero Belling, MD  meclizine (ANTIVERT) 25 MG tablet Take 1 tablet (25 mg total) by mouth 3 (three) times daily as needed for dizziness. 02/25/16   Rolland Porter, MD    Family History No family history on file.  Social History Social History  Substance Use Topics  . Smoking status: Never Smoker  . Smokeless tobacco: Never Used  . Alcohol use No     Allergies   Review of patient's allergies indicates no known allergies.   Review of Systems Review of Systems  Constitutional: Negative for appetite change, chills, diaphoresis, fatigue and fever.  HENT: Negative for mouth sores, sore throat and trouble swallowing.   Eyes: Negative for visual disturbance.  Respiratory: Negative for cough, chest tightness, shortness of breath and wheezing.   Cardiovascular: Negative for chest pain.  Gastrointestinal: Positive for nausea and vomiting. Negative for abdominal distention, abdominal pain and diarrhea.  Endocrine: Negative for polydipsia, polyphagia and polyuria.  Genitourinary: Negative for dysuria, frequency and hematuria.  Musculoskeletal: Negative for gait problem.  Skin: Negative for color change, pallor and rash.  Neurological: Positive for dizziness and headaches. Negative for syncope and light-headedness.  Hematological: Does not bruise/bleed easily.  Psychiatric/Behavioral: Negative for behavioral problems  and confusion.     Physical Exam Updated Vital Signs BP 143/80 (BP Location: Right Arm)   Pulse 72   Temp 98.7 F (37.1 C) (Oral)   Resp 16   Ht 6' (1.829 m)   Wt 249 lb (112.9 kg)   SpO2 99%   BMI 33.77 kg/m   Physical Exam  Constitutional: He is oriented to person, place, and time. He appears well-developed and well-nourished. No distress.  HENT:  Head:  Normocephalic.  Eyes: Conjunctivae are normal. Pupils are equal, round, and reactive to light. No scleral icterus.  Neck: Normal range of motion. Neck supple. No thyromegaly present.  Cardiovascular: Normal rate and regular rhythm.  Exam reveals no gallop and no friction rub.   No murmur heard. Pulmonary/Chest: Effort normal and breath sounds normal. No respiratory distress. He has no wheezes. He has no rales.  Abdominal: Soft. Bowel sounds are normal. He exhibits no distension. There is no tenderness. There is no rebound.  Musculoskeletal: Normal range of motion.  Neurological: He is alert and oriented to person, place, and time.  No nystagmus. Normal cranial nerves. Normal gait.  Normal symmetric Strength to shoulder shrug, triceps, biceps, grip,wrist flex/extend,and intrinsics  Norma lsymmetric sensation above and below clavicles, and to all distributions to UEs. Norma symmetric strength to flex/.extend hip and knees, dorsi/plantar flex ankles. Normal symmetric sensation to all distributions to LEs Patellar and achilles reflexes 1-2+. Downgoing Babinski   Skin: Skin is warm and dry. No rash noted.  Psychiatric: He has a normal mood and affect. His behavior is normal.     ED Treatments / Results  Labs (all labs ordered are listed, but only abnormal results are displayed) Labs Reviewed  COMPREHENSIVE METABOLIC PANEL - Abnormal; Notable for the following:       Result Value   Glucose, Bld 123 (*)    All other components within normal limits  CBC - Abnormal; Notable for the following:    WBC 13.0 (*)    All other components within normal limits  URINALYSIS, ROUTINE W REFLEX MICROSCOPIC (NOT [facility])    EKG  EKG Interpretation None       Radiology Ct Head Wo Contrast  Result Date: 02/25/2016 CLINICAL DATA:  Dizziness, feeling unwell and extreme nausea for 1 hour. Headache. Vertigo. Dry heaves. EXAM: CT HEAD WITHOUT CONTRAST TECHNIQUE: Contiguous axial images were obtained  from the base of the skull through the vertex without intravenous contrast. COMPARISON:  None. FINDINGS: BRAIN: The ventricles and sulci are normal. No intraparenchymal hemorrhage, mass effect nor midline shift. No acute large vascular territory infarcts. No abnormal extra-axial fluid collections. Basal cisterns are patent. VASCULAR: Unremarkable. SKULL/SOFT TISSUES: No skull fracture. No significant soft tissue swelling. ORBITS/SINUSES: The included ocular globes and orbital contents are normal.The mastoid aircells and included paranasal sinuses are well-aerated. OTHER: None. IMPRESSION: No acute intracranial process ; normal CT HEAD. Electronically Signed   By: Awilda Metroourtnay  Bloomer M.D.   On: 02/25/2016 22:58    Procedures Procedures (including critical care time)  Medications Ordered in ED Medications  diphenhydrAMINE (BENADRYL) injection 25 mg (25 mg Intravenous Given 02/25/16 2310)  metoCLOPramide (REGLAN) injection 10 mg (10 mg Intravenous Given 02/25/16 2310)  ketorolac (TORADOL) 30 MG/ML injection 30 mg (30 mg Intravenous Given 02/25/16 2310)  meclizine (ANTIVERT) tablet 25 mg (25 mg Oral Given 02/25/16 2315)  sodium chloride 0.9 % bolus 1,000 mL (1,000 mLs Intravenous New Bag/Given 02/25/16 2310)     Initial Impression / Assessment and Plan /  ED Course  I have reviewed the triage vital signs and the nursing notes.  Pertinent labs & imaging results that were available during my care of the patient were reviewed by me and considered in my medical decision making (see chart for details).  Clinical Course    Likely migraine headache. Vertigo either peripheral or secondary to migraine headache. After meds her symptoms have improved. CT normal. Is on hormone therapy but is nonsmoker. No focal deficits. Doubt thrombotic event.  Her discharge home. Symptomatic treatment. Antivert. Primary care follow-up.  Final Clinical Impressions(s) / ED Diagnoses   Final diagnoses:  Vertigo  Other  migraine without status migrainosus, not intractable    New Prescriptions New Prescriptions   MECLIZINE (ANTIVERT) 25 MG TABLET    Take 1 tablet (25 mg total) by mouth 3 (three) times daily as needed for dizziness.     Rolland PorterMark Shacara Cozine, MD 02/26/16 0001

## 2016-02-25 NOTE — ED Notes (Signed)
ED Provider at bedside. 

## 2016-02-25 NOTE — ED Notes (Signed)
Bed: WA22 Expected date:  Expected time:  Means of arrival:  Comments: Hyperglycemia/confused 

## 2016-02-25 NOTE — ED Triage Notes (Signed)
Pt presents with dizziness and feeling of "head swimming" along with extreme nausea x one hour. Pt also states that she has had dry heaves and a headache.  Pt denies any changes in vision or LOC.

## 2016-02-26 NOTE — ED Notes (Signed)
Pt ambulatory and independent at discharge.  Verbalized understanding of discharge instructions 

## 2016-03-03 ENCOUNTER — Ambulatory Visit: Payer: BLUE CROSS/BLUE SHIELD | Admitting: Psychology

## 2016-03-04 ENCOUNTER — Other Ambulatory Visit (HOSPITAL_COMMUNITY): Payer: Self-pay | Admitting: Gastroenterology

## 2016-03-04 ENCOUNTER — Encounter (HOSPITAL_COMMUNITY)
Admission: RE | Admit: 2016-03-04 | Discharge: 2016-03-04 | Disposition: A | Discharge status: Home / Self Care | Payer: BLUE CROSS/BLUE SHIELD | Source: Ambulatory Visit | Attending: Physician Assistant | Admitting: Physician Assistant

## 2016-03-04 DIAGNOSIS — R110 Nausea: Secondary | ICD-10-CM | POA: Diagnosis present

## 2016-03-04 DIAGNOSIS — R1013 Epigastric pain: Secondary | ICD-10-CM | POA: Diagnosis present

## 2016-03-04 DIAGNOSIS — R112 Nausea with vomiting, unspecified: Secondary | ICD-10-CM

## 2016-03-04 MED ORDER — TECHNETIUM TC 99M SULFUR COLLOID
2.0000 | Freq: Once | INTRAVENOUS | Status: AC | PRN
  Administered 2016-03-04: 2 via ORAL

## 2016-03-10 ENCOUNTER — Encounter (HOSPITAL_COMMUNITY): Admission: RE | Admit: 2016-03-10 | Payer: BLUE CROSS/BLUE SHIELD | Source: Ambulatory Visit

## 2016-03-22 ENCOUNTER — Ambulatory Visit (INDEPENDENT_AMBULATORY_CARE_PROVIDER_SITE_OTHER): Payer: BLUE CROSS/BLUE SHIELD | Admitting: Psychology

## 2016-03-22 DIAGNOSIS — F4323 Adjustment disorder with mixed anxiety and depressed mood: Secondary | ICD-10-CM

## 2016-04-12 ENCOUNTER — Ambulatory Visit (INDEPENDENT_AMBULATORY_CARE_PROVIDER_SITE_OTHER): Payer: BLUE CROSS/BLUE SHIELD | Admitting: Psychology

## 2016-04-12 DIAGNOSIS — F4323 Adjustment disorder with mixed anxiety and depressed mood: Secondary | ICD-10-CM

## 2016-05-09 NOTE — Progress Notes (Signed)
   Subjective:    Patient ID: Samantha Watkins, female    DOB: 06/07/1995, 21 y.o.   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  Specifically, Samantha Watkins says breast growth is much better recently.  Main symptom is weight gain.   Past Medical History:  Diagnosis Date  . Asthma    childhood  . GERD (gastroesophageal reflux disease)     Past Surgical History:  Procedure Laterality Date  . ADENOIDECTOMY    . TONSILLECTOMY      Social History   Social History  . Marital status: Married    Spouse name: N/A  . Number of children: N/A  . Years of education: N/A   Occupational History  . Not on file.   Social History Main Topics  . Smoking status: Never Smoker  . Smokeless tobacco: Never Used  . Alcohol use No  . Drug use: No  . Sexual activity: Not on file   Other Topics Concern  . Not on file   Social History Narrative  . No narrative on file    Current Outpatient Prescriptions on File Prior to Visit  Medication Sig Dispense Refill  . estradiol (ESTRACE) 2 MG tablet Take 1 tablet (2 mg total) by mouth daily. 90 tablet 3  . finasteride (PROPECIA) 1 MG tablet Take 1 tablet (1 mg total) by mouth daily. 30 tablet 11  . finasteride (PROSCAR) 5 MG tablet Take 0.5 tablets (2.5 mg total) by mouth daily. 30 tablet 5  . meclizine (ANTIVERT) 25 MG tablet Take 1 tablet (25 mg total) by mouth 3 (three) times daily as needed for dizziness. 30 tablet 0  . omeprazole (PRILOSEC) 20 MG capsule Take 1 capsule (20 mg total) by mouth daily. 30 capsule 0  . ondansetron (ZOFRAN ODT) 8 MG disintegrating tablet 8mg  ODT q4 hours prn nausea 4 tablet 0  . polyethylene glycol powder (GLYCOLAX/MIRALAX) powder Take 17 g by mouth 2 (two)  times daily. (Patient taking differently: Take 17 g by mouth daily. ) 255 g 0  . simethicone (MYLICON) 80 MG chewable tablet Chew 80 mg by mouth every 6 (six) hours as needed for flatulence.     No current facility-administered medications on file prior to visit.     No Known Allergies  No family history on file.  BP 122/87   Pulse 78   Ht 6' (1.829 m)   Wt 256 lb (116.1 kg)   SpO2 97%   BMI 34.72 kg/m    Review of Systems Denies edema.      Objective:   Physical Exam VITAL SIGNS:  See vs page.  GENERAL: no distress.  Ext: no edema.       Assessment & Plan:  Transgender state: clinically better.  Patient is advised the following: Patient Instructions  Please continue the same estrogen pill.  blood tests are requested for you today.  We'll let you know about the results.  Please come back for a follow-up appointment in 4-6 months.  Please call if you have leg swelling, pain, or cramps.

## 2016-05-10 ENCOUNTER — Encounter: Payer: Self-pay | Admitting: Endocrinology

## 2016-05-10 ENCOUNTER — Ambulatory Visit: Payer: BLUE CROSS/BLUE SHIELD | Admitting: Psychology

## 2016-05-10 ENCOUNTER — Ambulatory Visit (INDEPENDENT_AMBULATORY_CARE_PROVIDER_SITE_OTHER): Payer: Managed Care, Other (non HMO) | Admitting: Endocrinology

## 2016-05-10 VITALS — BP 122/87 | HR 78 | Ht 72.0 in | Wt 256.0 lb

## 2016-05-10 DIAGNOSIS — F640 Transsexualism: Secondary | ICD-10-CM | POA: Diagnosis not present

## 2016-05-10 DIAGNOSIS — Z789 Other specified health status: Secondary | ICD-10-CM

## 2016-05-10 NOTE — Patient Instructions (Addendum)
Please continue the same estrogen pill.  blood tests are requested for you today.  We'll let you know about the results.  Please come back for a follow-up appointment in 4-6 months.  Please call if you have leg swelling, pain, or cramps.

## 2016-05-12 ENCOUNTER — Other Ambulatory Visit: Payer: Self-pay | Admitting: Endocrinology

## 2016-05-14 LAB — TESTOSTERONE,FREE AND TOTAL
TESTOSTERONE: 216 ng/dL — AB (ref 264–916)
Testosterone, Free: 5.3 pg/mL — ABNORMAL LOW (ref 9.3–26.5)

## 2016-06-02 ENCOUNTER — Ambulatory Visit (INDEPENDENT_AMBULATORY_CARE_PROVIDER_SITE_OTHER): Payer: Managed Care, Other (non HMO) | Admitting: Psychology

## 2016-06-02 DIAGNOSIS — F4323 Adjustment disorder with mixed anxiety and depressed mood: Secondary | ICD-10-CM | POA: Diagnosis not present

## 2016-11-08 ENCOUNTER — Ambulatory Visit: Payer: Self-pay | Admitting: Endocrinology

## 2016-12-13 ENCOUNTER — Encounter: Payer: Self-pay | Admitting: Endocrinology

## 2016-12-13 ENCOUNTER — Ambulatory Visit (INDEPENDENT_AMBULATORY_CARE_PROVIDER_SITE_OTHER): Payer: Managed Care, Other (non HMO) | Admitting: Endocrinology

## 2016-12-13 VITALS — BP 116/78 | HR 96 | Wt 269.6 lb

## 2016-12-13 DIAGNOSIS — Z789 Other specified health status: Secondary | ICD-10-CM

## 2016-12-13 DIAGNOSIS — F640 Transsexualism: Secondary | ICD-10-CM

## 2016-12-13 NOTE — Progress Notes (Signed)
   Subjective:    Patient ID: Samantha Watkins, female    DOB: [birth year], 21 y.o.   MRN: [id number]  HPI Pt returns for f/u of transgender state (M to F; she first noted at age 40; she has been to counseling (and was told she needed to come back only PRN), but has not had surgery; she has been on estradiol since early 2017; no h/o DVT; she reported weight gain, but ON dex test was normal; she declines orchiectomy for now; husband is TG, F to M).  She says breast growth is steadily improving.  Facial hair is less now (she says possibly due to laser rx).   Past Medical History:  Diagnosis Date  . Asthma    childhood  . GERD (gastroesophageal reflux disease)     Past Surgical History:  Procedure Laterality Date  . ADENOIDECTOMY    . TONSILLECTOMY      Social History   Social History  . Marital status: Married    Spouse name: N/A  . Number of children: N/A  . Years of education: N/A   Occupational History  . Not on file.   Social History Main Topics  . Smoking status: Never Smoker  . Smokeless tobacco: Never Used  . Alcohol use No  . Drug use: No  . Sexual activity: Not on file   Other Topics Concern  . Not on file   Social History Narrative  . No narrative on file    Current Outpatient Prescriptions on File Prior to Visit  Medication Sig Dispense Refill  . estradiol (ESTRACE) 2 MG tablet Take 1 tablet (2 mg total) by mouth daily. 90 tablet 3  . finasteride (PROSCAR) 5 MG tablet Take 0.5 tablets (2.5 mg total) by mouth daily. 30 tablet 5  . meclizine (ANTIVERT) 25 MG tablet Take 1 tablet (25 mg total) by mouth 3 (three) times daily as needed for dizziness. 30 tablet 0  . omeprazole (PRILOSEC) 20 MG capsule Take 1 capsule (20 mg total) by mouth daily. 30 capsule 0  . ondansetron (ZOFRAN ODT) 8 MG disintegrating tablet 8mg  ODT q4 hours prn nausea 4 tablet 0  . polyethylene glycol powder (GLYCOLAX/MIRALAX) powder Take 17 g by mouth 2 (two) times daily. (Patient taking  differently: Take 17 g by mouth daily. ) 255 g 0  . simethicone (MYLICON) 80 MG chewable tablet Chew 80 mg by mouth every 6 (six) hours as needed for flatulence.     No current facility-administered medications on file prior to visit.     No Known Allergies  No family history on file.  BP 116/78   Pulse 96   Wt 269 lb 9.6 oz (122.3 kg)   SpO2 98%   BMI 36.56 kg/m   Review of Systems Denies leg pain.     Objective:   Physical Exam VITAL SIGNS:  See vs page.  GENERAL: no distress. Ext: no edema.  Skin: moderate terminal hair on the legs.      Assessment & Plan:  Transgender state: slow progress Facial hair growth: improvement is most likely due to laser rx.  Patient Instructions  Please continue the same medications. Please return in 1 year.

## 2016-12-13 NOTE — Patient Instructions (Addendum)
Please continue the same medications.   Please return in 1 year.  

## 2017-02-23 ENCOUNTER — Encounter: Payer: Self-pay | Admitting: *Deleted

## 2017-02-24 ENCOUNTER — Encounter: Payer: Self-pay | Admitting: Neurology

## 2017-02-24 ENCOUNTER — Encounter: Payer: Self-pay | Admitting: *Deleted

## 2017-02-24 ENCOUNTER — Ambulatory Visit (INDEPENDENT_AMBULATORY_CARE_PROVIDER_SITE_OTHER): Payer: 59 | Admitting: Neurology

## 2017-02-24 DIAGNOSIS — G43119 Migraine with aura, intractable, without status migrainosus: Secondary | ICD-10-CM | POA: Diagnosis not present

## 2017-02-24 DIAGNOSIS — G43909 Migraine, unspecified, not intractable, without status migrainosus: Secondary | ICD-10-CM

## 2017-02-24 HISTORY — DX: Migraine, unspecified, not intractable, without status migrainosus: G43.909

## 2017-02-24 MED ORDER — RIZATRIPTAN BENZOATE 10 MG PO TABS: 10.0000 mg | ORAL_TABLET | Freq: Three times a day (TID) | ORAL | 3 refills | Status: DC | PRN

## 2017-02-24 MED ORDER — TOPIRAMATE 25 MG PO TABS: ORAL_TABLET | ORAL | 3 refills | Status: DC

## 2017-02-24 NOTE — Progress Notes (Signed)
Reason for visit: Migraine headache  Referring physician: Dr. Leron CroakWharton  Samantha Watkins is a 21 y.o. female  History of present illness:  [name] is a 21 year old right-handed white female with a history of migraine headaches that have been present off and on over the last 1 year.  The patient is a transgender person going from female to female, he has been on hormone therapy for about 2 years.  He claims that there has been no change in dosing of medication over the last 2 years.  About 1 year ago, he began having episodes of headaches associated with vertigo.  A CT scan of the brain was done on 25 February 2016 and was unremarkable.  He claims that the headaches usually begin in the temporal and frontal areas and behind the eyes, he will initially have a fuzzyheaded feeling as if he cannot focus or concentrate.  Once the headache begins, the vertigo may start associated with nausea.  The patient may have some blurring of vision, no loss of vision or geometric shapes in the vision.  He denies any other associated symptoms such as numbness or weakness of the face, arms, or legs.  He denies any neck stiffness.  The headache episodes may last anywhere from 2 hours to up to 5 hours.  The headaches initially were occurring about once a month, but now they are occurring up to 4 times a week.  The patient takes Excedrin Migraine, he may take Zofran and meclizine for the dizziness.  The patient drinks 1 cup of green tea a day, no other caffeinated products are consumed.  The patient denies any particular activating factors for the headache.  His mother and sister also have migraine.  The patient is sent to this office for an evaluation.  Past Medical History:  Diagnosis Date  . BPV (benign positional vertigo)   . GERD (gastroesophageal reflux disease)   . Migraine     Past Surgical History:  Procedure Laterality Date  . TONSILLECTOMY      History reviewed. No pertinent family history.  Social  history:  reports that she has quit smoking. She has never used smokeless tobacco. She reports that she does not drink alcohol or use drugs.  Medications:  Prior to Admission medications   Medication Sig Start Date End Date Taking? Authorizing Provider  estradiol (ESTRACE) 2 MG tablet Take 2 mg by mouth daily.   Yes [provider]  finasteride (PROSCAR) 5 MG tablet Take 5 mg by mouth daily.   Yes [provider]  ibuprofen (ADVIL,MOTRIN) 200 MG tablet Take 200 mg by mouth every 6 (six) hours as needed.   Yes [provider]  meclizine (ANTIVERT) 25 MG tablet Take 25 mg by mouth as needed for dizziness.   Yes [provider]  omeprazole (PRILOSEC) 20 MG capsule Take 20 mg by mouth daily.   Yes [provider]  ondansetron (ZOFRAN) 8 MG tablet Take 8 mg by mouth every 8 (eight) hours as needed for nausea or vomiting.   Yes [provider]     Not on File  ROS:  Out of a complete 12 point review of symptoms, the patient complains only of the following symptoms, and all other reviewed systems are negative.  Fatigue Blurred vision, eye pain Joint pain Headache Not enough sleep, decreased energy, change in appetite  Blood pressure 126/71, pulse 73, height 5' 11.5" (1.816 m), weight 266 lb 8 oz (120.9 kg).  Physical Exam  General: The patient is alert and cooperative at the time of the examination.  The patient is moderately obese.  Eyes: Pupils are equal, round, and reactive to light. Discs are flat bilaterally.  Venous pulsations are present bilaterally.  Neck: The neck is supple, no carotid bruits are noted.  Respiratory: The respiratory examination is clear.  Cardiovascular: The cardiovascular examination reveals a regular rate and rhythm, no obvious murmurs or rubs are noted.  Skin: Extremities are without significant edema.  Neurologic Exam  Mental status: The patient is alert and oriented x 3 at the time of the  examination. The patient has apparent normal recent and remote memory, with an apparently normal attention span and concentration ability.  Cranial nerves: Facial symmetry is present. There is good sensation of the face to pinprick and soft touch bilaterally. The strength of the facial muscles and the muscles to head turning and shoulder shrug are normal bilaterally. Speech is well enunciated, no aphasia or dysarthria is noted. Extraocular movements are full. Visual fields are full. The tongue is midline, and the patient has symmetric elevation of the soft palate. No obvious hearing deficits are noted.  Motor: The motor testing reveals 5 over 5 strength of all 4 extremities. Good symmetric motor tone is noted throughout.  Sensory: Sensory testing is intact to pinprick, soft touch, vibration sensation, and position sense on all 4 extremities. No evidence of extinction is noted.  Coordination: Cerebellar testing reveals good finger-nose-finger and heel-to-shin bilaterally.  Gait and station: Gait is normal. Tandem gait is normal. Romberg is negative. No drift is seen.  Reflexes: Deep tendon reflexes are symmetric and normal bilaterally. Toes are downgoing bilaterally.   Assessment/Plan:  1.  Migraine headache  The patient is having migraine associated with vertigo.  The episodes of headache are becoming more frequent, the patient will be placed on Topamax working up to 75 mg at night.  He will be given a prescription for Maxalt to take if needed.  He will follow-up in 3 months, sooner if needed.  He is to call for any dose adjustments of his medication.  Marlan Palau MD 02/24/2017 9:38 AM  [facility] [address] [place], [place] [address]  Phone [phone] Fax [phone]

## 2017-02-24 NOTE — Patient Instructions (Signed)
   We will start Topamax at night for the headache prevention, and use maxalt if needed for the headache.  Topamax (topiramate) is a seizure medication that has an FDA approval for seizures and for migraine headache. Potential side effects of this medication include weight loss, cognitive slowing, tingling in the fingers and toes, and carbonated drinks will taste bad. If any significant side effects are noted on this drug, please contact our office.

## 2017-03-20 ENCOUNTER — Telehealth: Payer: Self-pay | Admitting: Endocrinology

## 2017-03-20 ENCOUNTER — Other Ambulatory Visit: Payer: Self-pay

## 2017-03-20 MED ORDER — FINASTERIDE 5 MG PO TABS: 2.5000 mg | ORAL_TABLET | Freq: Every day | ORAL | 5 refills | Status: DC

## 2017-03-20 MED ORDER — ESTRADIOL 2 MG PO TABS: 2.0000 mg | ORAL_TABLET | Freq: Every day | ORAL | 3 refills | Status: DC

## 2017-03-20 NOTE — Telephone Encounter (Signed)
[facility] FINASTERIDE to [facility] on [address] need refilled please. Per pt no refills remain. Call pt [phone] if needed.

## 2017-03-20 NOTE — Telephone Encounter (Signed)
Done

## 2017-05-29 NOTE — Progress Notes (Signed)
[facility]  PATIENT: Samantha Watkins DOB: 12/24/1995   REASON FOR VISIT: Follow-up for migraine HISTORY FROM: Patient    HISTORY OF PRESENT ILLNESS:UPDATE 2/5/2019CM [name], 22 year old returns for follow-up with history of migraine headaches.  Her headaches usually begin in the temporal and frontal areas and behind the eyes making it difficult to focus or concentrate.  The patient is transgender going from female to female and has been on hormone therapy  for over 2 years. Once the headache began the patient may have vertigo associated with some nausea when last seen patient was having 4 headaches per week.  She was placed on Topamax by Dr. Anne HahnWillis.  She is currently taking 75 mg at night.  She has had 3 headaches since being on full dose.  She takes Maxalt acutely.  Stress is a big trigger for the patient.  She is currently in nursing school.  She returns for reevaluation 02/24/17 KWMr. [name] is a 22 year old right-handed white female with a history of migraine headaches that have been present off and on over the last 1 year.  The patient is a transgender person going from female to female, he has been on hormone therapy for about 2 years.  He claims that there has been no change in dosing of medication over the last 2 years.  About 1 year ago, he began having episodes of headaches associated with vertigo.  A CT scan of the brain was done on 25 February 2016 and was unremarkable.  He claims that the headaches usually begin in the temporal and frontal areas and behind the eyes, he will initially have a fuzzyheaded feeling as if he cannot focus or concentrate.  Once the headache begins, the vertigo may start associated with nausea.  The patient may have some blurring of vision, no loss of vision or geometric shapes in the vision.  He denies any other associated symptoms such as numbness or weakness of the face, arms, or legs.  He denies any neck stiffness.  The headache episodes  may last anywhere from 2 hours to up to 5 hours.  The headaches initially were occurring about once a month, but now they are occurring up to 4 times a week.  The patient takes Excedrin Migraine, he may take Zofran and meclizine for the dizziness.  The patient drinks 1 cup of green tea a day, no other caffeinated products are consumed.  The patient denies any particular activating factors for the headache.  His mother and sister also have migraine.  The patient is sent to this office for an evaluation. REVIEW OF SYSTEMS: Full 14 system review of systems performed and notable only for those listed, all others are neg:  Constitutional: neg  Cardiovascular: neg Ear/Nose/Throat: neg  Skin: neg Eyes: neg Respiratory: neg Gastroitestinal: neg  Hematology/Lymphatic: neg  Endocrine: neg Musculoskeletal:neg Allergy/Immunology: neg Neurological: Migraine headache Psychiatric: neg Sleep : neg   ALLERGIES: Allergies  Allergen Reactions  . Other     HOME MEDICATIONS: Outpatient Medications Prior to Visit  Medication Sig Dispense Refill  . estradiol (ESTRACE) 2 MG tablet Take 2 mg by mouth daily.    . finasteride (PROSCAR) 5 MG tablet Take 0.5 tablets (2.5 mg total) by mouth daily. 30 tablet 5  . finasteride (PROSCAR) 5 MG tablet Take 5 mg by mouth daily.    Marland Kitchen. ibuprofen (ADVIL,MOTRIN) 200 MG tablet Take 200 mg by mouth every 6 (six) hours as needed.    . meclizine (ANTIVERT) 25 MG tablet  Take 1 tablet (25 mg total) by mouth 3 (three) times daily as needed for dizziness. 30 tablet 0  . omeprazole (PRILOSEC) 20 MG capsule Take 1 capsule (20 mg total) by mouth daily. 30 capsule 0  . ondansetron (ZOFRAN ODT) 8 MG disintegrating tablet 8mg  ODT q4 hours prn nausea 4 tablet 0  . polyethylene glycol powder (GLYCOLAX/MIRALAX) powder Take 17 g by mouth 2 (two) times daily. (Patient taking differently: Take 17 g by mouth daily. ) 255 g 0  . rizatriptan (MAXALT) 10 MG tablet Take 1 tablet (10 mg total) by  mouth 3 (three) times daily as needed for migraine. 10 tablet 3  . simethicone (MYLICON) 80 MG chewable tablet Chew 80 mg by mouth every 6 (six) hours as needed for flatulence.    . topiramate (TOPAMAX) 25 MG tablet Take one tablet at night for one week, then take 2 tablets at night for one week, then take 3 tablets at night. 90 tablet 3  . estradiol (ESTRACE) 0.5 MG tablet estradiol 0.5 mg tablet  TAKE 1 TABLET (0.5 MG TOTAL) BY MOUTH DAILY.    Marland Kitchen estradiol (ESTRACE) 2 MG tablet Take 1 tablet (2 mg total) by mouth daily. (Patient not taking: Reported on 05/30/2017) 90 tablet 3  . finasteride (PROPECIA) 1 MG tablet finasteride 1 mg tablet    . meclizine (ANTIVERT) 25 MG tablet meclizine 25 mg tablet  TAKE 1 TABLET BY MOUTH EVERY 6-8 HOURS AS NEEDED FOR VERTIGO    . meclizine (ANTIVERT) 25 MG tablet Take 25 mg by mouth as needed for dizziness.    Marland Kitchen omeprazole (PRILOSEC) 20 MG capsule Take 20 mg by mouth daily.    . ondansetron (ZOFRAN) 8 MG tablet Take 8 mg by mouth every 8 (eight) hours as needed for nausea or vomiting.     No facility-administered medications prior to visit.     PAST MEDICAL HISTORY: Past Medical History:  Diagnosis Date  . Anxiety   . Asthma    childhood  . BPV (benign positional vertigo)   . Depression   . GERD (gastroesophageal reflux disease)   . Female-to-female transgender person   . Migraine   . Migraine headache 02/24/2017    PAST SURGICAL HISTORY: Past Surgical History:  Procedure Laterality Date  . ADENOIDECTOMY    . TONSILLECTOMY      FAMILY HISTORY: Family History  Problem Relation Age of Onset  . GER disease Mother   . Asthma Mother   . Sleep apnea Mother   . Fibromyalgia Mother   . Irritable bowel syndrome Mother   . GER disease Father   . Narcolepsy Father   . Sleep apnea Father   . Arthritis Father   . Asthma Sister   . Asthma Brother   . Heart attack Maternal Grandmother   . Chronic Renal Failure Paternal Grandfather     SOCIAL  HISTORY: Social History   Socioeconomic History  . Marital status: Married    Spouse name: Samuel Bouche  . Number of children: 0  . Years of education: College  . Highest education level: Not on file  Social Needs  . Financial resource strain: Not on file  . Food insecurity - worry: Not on file  . Food insecurity - inability: Not on file  . Transportation needs - medical: Not on file  . Transportation needs - non-medical: Not on file  Occupational History  . Occupation: Oceanographer  . 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  Smoking status: Former Games developer  . Smokeless tobacco: Never Used  Substance and Sexual Activity  . Alcohol use: No  . Drug use: No  . Sexual activity: Not on file  Other Topics Concern  . Not on file  Social History Narrative    Merged History Encounter         Data from: 02/23/17 Enc Dept: [facility]   Lives   Caffeine use:         Data from: 02/24/17 Enc Dept: [facility]   Lives with husband   Caffeine use: 600cc/day   Right handed      PHYSICAL EXAM  Vitals:   05/30/17 0924  BP: 120/73  Pulse: 71  Weight: 263 lb (119.3 kg)   Body mass index is 36.17 kg/m.  Generalized: Well developed, moderately obese in no acute distress  Head: normocephalic and atraumatic,. Oropharynx benign  Neck: Supple,  Musculoskeletal: No deformity   Neurological examination   Mentation: Alert oriented to time, place, history taking. Attention span and concentration appropriate. Recent and remote memory intact.  Follows all commands speech and language fluent.   Cranial nerve II-XII: Pupils were equal round reactive to light extraocular movements were full, visual field were full on confrontational test. Facial sensation and strength were normal. hearing was intact to finger rubbing bilaterally. Uvula tongue midline. head turning and shoulder shrug were normal and symmetric.Tongue protrusion into cheek strength was  normal. Motor: normal bulk and tone, full strength in the BUE, BLE, fine finger movements normal, no pronator drift. No focal weakness Sensory: normal and symmetric to light touch, pinprick, and  Vibration, in the upper and lower extremities Coordination: finger-nose-finger, heel-to-shin bilaterally, no dysmetria Reflexes: Brachioradialis 2/2, biceps 2/2, triceps 2/2, patellar 2/2, Achilles 2/2, plantar responses were flexor bilaterally. Gait and Station: Rising up from seated position without assistance, normal stance,  moderate stride, good arm swing, smooth turning, able to perform tiptoe, and heel walking without difficulty. Tandem gait is steady  DIAGNOSTIC DATA (LABS, IMAGING, TESTING) - I reviewed patient records, labs, notes, testing and imaging myself where available.  Lab Results  Component Value Date   WBC 13.0 (H) 02/25/2016   HGB 14.0 02/25/2016   HCT 40.9 02/25/2016   MCV 81.8 02/25/2016   PLT 320 02/25/2016      Component Value Date/Time   NA 141 02/25/2016 2149   K 3.8 02/25/2016 2149   CL 108 02/25/2016 2149   CO2 25 02/25/2016 2149   GLUCOSE 123 (H) 02/25/2016 2149   BUN 11 02/25/2016 2149   CREATININE 0.86 02/25/2016 2149   CALCIUM 9.2 02/25/2016 2149   PROT 8.0 02/25/2016 2149   ALBUMIN 4.6 02/25/2016 2149   AST 30 02/25/2016 2149   ALT 24 02/25/2016 2149   ALKPHOS 70 02/25/2016 2149   BILITOT 0.6 02/25/2016 2149   GFRNONAA >60 02/25/2016 2149   GFRAA >60 02/25/2016 2149    ASSESSMENT AND PLAN  22 y.o. year old female  has a past medical history of Anxiety, Asthma, BPV (benign positional vertigo), Depression, GERD (gastroesophageal reflux disease), Female-to-female transgender person, Migraine, and Migraine headache (02/24/2017). here to follow-up for her migraine headaches which are also associated with vertigo.  Since being placed on Topamax she has had 3 headaches in 3 months.  This is a great improvement from 4 times a week previously    PLAN: Topamax 75  mg will refill Continue Maxalt acutely Keep a record of your headaches if they worsen Follow-up in 6 months Harriett Sine  Cecille Rubin, [facility], [facility], APRN  [facility] [address] [place],  [address] [phone]

## 2017-05-30 ENCOUNTER — Ambulatory Visit: Payer: 59 | Admitting: Nurse Practitioner

## 2017-05-30 ENCOUNTER — Encounter: Payer: Self-pay | Admitting: Nurse Practitioner

## 2017-05-30 VITALS — BP 120/73 | HR 71 | Wt 263.0 lb

## 2017-05-30 DIAGNOSIS — F640 Transsexualism: Secondary | ICD-10-CM | POA: Diagnosis not present

## 2017-05-30 DIAGNOSIS — G43119 Migraine with aura, intractable, without status migrainosus: Secondary | ICD-10-CM | POA: Diagnosis not present

## 2017-05-30 DIAGNOSIS — Z789 Other specified health status: Secondary | ICD-10-CM

## 2017-05-30 MED ORDER — TOPIRAMATE 25 MG PO TABS: ORAL_TABLET | ORAL | 6 refills | Status: DC

## 2017-05-30 NOTE — Patient Instructions (Signed)
Continue Topamax 75 mg will refill Continue Maxalt acutely Keep a record of your headaches if they worsen Follow-up in 6 months

## 2017-05-30 NOTE — Progress Notes (Signed)
I have read the note, and I agree with the clinical assessment and plan.  Jacqueline Delapena K Teriah Muela   

## 2017-07-07 ENCOUNTER — Other Ambulatory Visit: Payer: Self-pay | Admitting: Neurology

## 2017-07-27 ENCOUNTER — Encounter (HOSPITAL_BASED_OUTPATIENT_CLINIC_OR_DEPARTMENT_OTHER): Payer: Self-pay | Admitting: Radiology

## 2017-07-27 ENCOUNTER — Other Ambulatory Visit (HOSPITAL_BASED_OUTPATIENT_CLINIC_OR_DEPARTMENT_OTHER): Payer: Self-pay | Admitting: Family Medicine

## 2017-07-27 ENCOUNTER — Ambulatory Visit (HOSPITAL_BASED_OUTPATIENT_CLINIC_OR_DEPARTMENT_OTHER)
Admission: RE | Admit: 2017-07-27 | Discharge: 2017-07-27 | Disposition: A | Discharge status: Home / Self Care | Payer: 59 | Source: Ambulatory Visit | Attending: Family Medicine | Admitting: Family Medicine

## 2017-07-27 DIAGNOSIS — R0600 Dyspnea, unspecified: Secondary | ICD-10-CM

## 2017-07-27 DIAGNOSIS — R7989 Other specified abnormal findings of blood chemistry: Secondary | ICD-10-CM | POA: Diagnosis present

## 2017-07-27 MED ORDER — IOPAMIDOL (ISOVUE-370) INJECTION 76%
100.0000 mL | Freq: Once | INTRAVENOUS | Status: AC | PRN
  Administered 2017-07-27: 100 mL via INTRAVENOUS

## 2017-08-19 IMAGING — NM NM GASTRIC EMPTYING
3 series · 3 of 3 positions shown · non-contrast
Comparison: Abdominal ultrasound September 08, 2015 and hepatobiliary
scan of December 29, 2015

CLINICAL DATA: Left upper quadrant pain with nausea, early satiety,
bloating, and reflux. For the past 8 months.

EXAM:
NUCLEAR MEDICINE GASTRIC EMPTYING SCAN
TECHNIQUE: After oral ingestion of radiolabeled meal, sequential abdominal
images were obtained for 120 minutes. Residual percentage of
activity remaining within the stomach was calculated at 60 and 120
minutes.
RADIOPHARMACEUTICALS:  2.2 mCi Lc-88m sulfur colloid in standardized
meal

[Series 1: 0 min · 4.14mm/px · 1 of 1 slices shown]
[im 1/1]
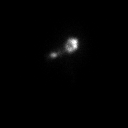

[Series 2: 1 hr · 4.14mm/px · 1 of 1 slices shown]
[im 1/1]
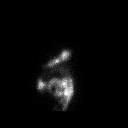

[Series 3: 2 hr · 4.14mm/px · 1 of 1 slices shown]
[im 1/1]
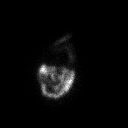

[3 of 3 positions shown; findings below may reference images not displayed]

FINDINGS: Expected location of the stomach in the left upper quadrant.
Ingested meal empties the stomach gradually over the course of the
study with 59% retention at 60 min and 94% retention at 120 min
(normal retention less than 30% at a 120 min).
IMPRESSION: Normal gastric emptying study.

## 2017-09-07 ENCOUNTER — Encounter: Payer: Self-pay | Admitting: Endocrinology

## 2017-09-08 ENCOUNTER — Telehealth: Payer: Self-pay | Admitting: Endocrinology

## 2017-09-08 NOTE — Telephone Encounter (Signed)
Have you received this paperwork ?

## 2017-09-08 NOTE — Telephone Encounter (Signed)
Not yet

## 2017-09-08 NOTE — Telephone Encounter (Signed)
Patient stated that he sent two letters to be filled out by Everardo All, want to know if you have received them. Please advise

## 2017-09-11 NOTE — Telephone Encounter (Signed)
I spoke with patient & have put paperwork in accordion folder in office for her to pick up.

## 2017-09-11 NOTE — Telephone Encounter (Signed)
done

## 2017-09-11 NOTE — Telephone Encounter (Signed)
I have put paperwork on your desk. Thanks!

## 2017-10-25 ENCOUNTER — Other Ambulatory Visit: Payer: Self-pay | Admitting: Endocrinology

## 2017-10-25 ENCOUNTER — Encounter: Payer: Self-pay | Admitting: Endocrinology

## 2017-10-25 MED ORDER — NORETHINDRONE 0.35 MG PO TABS: 1.0000 | ORAL_TABLET | Freq: Every day | ORAL | 0 refills | Status: DC

## 2017-11-22 ENCOUNTER — Other Ambulatory Visit: Payer: Self-pay

## 2017-11-22 ENCOUNTER — Telehealth: Payer: Self-pay | Admitting: Emergency Medicine

## 2017-11-22 MED ORDER — FINASTERIDE 5 MG PO TABS: 5.0000 mg | ORAL_TABLET | Freq: Every day | ORAL | 1 refills | Status: DC

## 2017-11-22 NOTE — Telephone Encounter (Signed)
Yes, please refill at 5 mg qd

## 2017-11-22 NOTE — Telephone Encounter (Signed)
I have sent in for patient.  

## 2017-11-22 NOTE — Telephone Encounter (Signed)
Patients husband called and stated patient needs a refill on her Sinasteribe. [facility] [place]. Thanks.

## 2017-11-22 NOTE — Telephone Encounter (Signed)
I believe this is suuposed be the Proscar? Ok to refill?

## 2017-11-24 NOTE — Progress Notes (Deleted)
[facility]  PATIENT: Samantha Watkins DOB: 01/03/1996   REASON FOR VISIT: Follow-up for migraine HISTORY FROM: Patient    HISTORY OF PRESENT ILLNESS:UPDATE 2/5/2019CM [name], 22 year old returns for follow-up with history of migraine headaches.  Her headaches usually begin in the temporal and frontal areas and behind the eyes making it difficult to focus or concentrate.  The patient is transgender going from female to female and has been on hormone therapy  for over 2 years. Once the headache began the patient may have vertigo associated with some nausea when last seen patient was having 4 headaches per week.  She was placed on Topamax by Dr. Anne HahnWillis.  She is currently taking 75 mg at night.  She has had 3 headaches since being on full dose.  She takes Maxalt acutely.  Stress is a big trigger for the patient.  She is currently in nursing school.  She returns for reevaluation 02/24/17 [name] is a 22 year old right-handed white female with a history of migraine headaches that have been present off and on over the last 1 year.  The patient is a transgender person going from female to female, he has been on hormone therapy for about 2 years.  He claims that there has been no change in dosing of medication over the last 2 years.  About 1 year ago, he began having episodes of headaches associated with vertigo.  A CT scan of the brain was done on 25 February 2016 and was unremarkable.  He claims that the headaches usually begin in the temporal and frontal areas and behind the eyes, he will initially have a fuzzyheaded feeling as if he cannot focus or concentrate.  Once the headache begins, the vertigo may start associated with nausea.  The patient may have some blurring of vision, no loss of vision or geometric shapes in the vision.  He denies any other associated symptoms such as numbness or weakness of the face, arms, or legs.  He denies any neck stiffness.  The headache episodes  may last anywhere from 2 hours to up to 5 hours.  The headaches initially were occurring about once a month, but now they are occurring up to 4 times a week.  The patient takes Excedrin Migraine, he may take Zofran and meclizine for the dizziness.  The patient drinks 1 cup of green tea a day, no other caffeinated products are consumed.  The patient denies any particular activating factors for the headache.  His mother and sister also have migraine.  The patient is sent to this office for an evaluation. REVIEW OF SYSTEMS: Full 14 system review of systems performed and notable only for those listed, all others are neg:  Constitutional: neg  Cardiovascular: neg Ear/Nose/Throat: neg  Skin: neg Eyes: neg Respiratory: neg Gastroitestinal: neg  Hematology/Lymphatic: neg  Endocrine: neg Musculoskeletal:neg Allergy/Immunology: neg Neurological: Migraine headache Psychiatric: neg Sleep : neg   ALLERGIES: Allergies  Allergen Reactions  . Other     HOME MEDICATIONS: Outpatient Medications Prior to Visit  Medication Sig Dispense Refill  . estradiol (ESTRACE) 2 MG tablet Take 2 mg by mouth daily.    . finasteride (PROSCAR) 5 MG tablet Take 0.5 tablets (2.5 mg total) by mouth daily. 30 tablet 5  . finasteride (PROSCAR) 5 MG tablet Take 1 tablet (5 mg total) by mouth daily. 30 tablet 1  . ibuprofen (ADVIL,MOTRIN) 200 MG tablet Take 200 mg by mouth every 6 (six) hours as needed.    . meclizine (  ANTIVERT) 25 MG tablet Take 1 tablet (25 mg total) by mouth 3 (three) times daily as needed for dizziness. 30 tablet 0  . norethindrone (MICRONOR,CAMILA,ERRIN) 0.35 MG tablet Take 1 tablet (0.35 mg total) by mouth daily. 3 Package 0  . omeprazole (PRILOSEC) 20 MG capsule Take 1 capsule (20 mg total) by mouth daily. 30 capsule 0  . ondansetron (ZOFRAN ODT) 8 MG disintegrating tablet 8mg  ODT q4 hours prn nausea 4 tablet 0  . polyethylene glycol powder (GLYCOLAX/MIRALAX) powder Take 17 g by mouth 2 (two) times  daily. (Patient taking differently: Take 17 g by mouth daily. ) 255 g 0  . rizatriptan (MAXALT) 10 MG tablet Take 1 tablet (10 mg total) by mouth 3 (three) times daily as needed for migraine. 10 tablet 3  . simethicone (MYLICON) 80 MG chewable tablet Chew 80 mg by mouth every 6 (six) hours as needed for flatulence.    . topiramate (TOPAMAX) 25 MG tablet Take  3 tablets at night. 90 tablet 6   No facility-administered medications prior to visit.     PAST MEDICAL HISTORY: Past Medical History:  Diagnosis Date  . Anxiety   . Asthma    childhood  . BPV (benign positional vertigo)   . Depression   . GERD (gastroesophageal reflux disease)   . Female-to-female transgender person   . Migraine   . Migraine headache 02/24/2017    PAST SURGICAL HISTORY: Past Surgical History:  Procedure Laterality Date  . ADENOIDECTOMY    . TONSILLECTOMY      FAMILY HISTORY: Family History  Problem Relation Age of Onset  . GER disease Mother   . Asthma Mother   . Sleep apnea Mother   . Fibromyalgia Mother   . Irritable bowel syndrome Mother   . GER disease Father   . Narcolepsy Father   . Sleep apnea Father   . Arthritis Father   . Asthma Sister   . Asthma Brother   . Heart attack Maternal Grandmother   . Chronic Renal Failure Paternal Grandfather     SOCIAL HISTORY: Social History   Socioeconomic History  . Marital status: Married    Spouse name: Samuel Bouche  . Number of children: 0  . Years of education: College  . Highest education level: Not on file  Occupational History  . 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 Financial resource strain: Not on file  . Food insecurity:    Worry: Not on file    Inability: Not on file  . Transportation needs:    Medical: Not on file    Non-medical: Not on file  Tobacco Use  . Smoking status: Former Games developer  . Smokeless tobacco: Never Used  Substance and Sexual Activity  . Alcohol use: No  .  Drug use: No  . Sexual activity: Not on file  Lifestyle  . Physical activity:    Days per week: Not on file    Minutes per session: Not on file  . Stress: Not on file  Relationships  . Social connections:    Talks on phone: Not on file    Gets together: Not on file    Attends religious service: Not on file    Active member of club or organization: Not on file    Attends meetings of clubs or organizations: Not on file    Relationship status: Not on file  . Intimate partner violence:    Fear of current or  ex partner: Not on file    Emotionally abused: Not on file    Physically abused: Not on file    Forced sexual activity: Not on file  Other Topics Concern  . Not on file  Social History Narrative    Merged History Encounter         Data from: 02/23/17 Enc Dept: [facility]   Lives   Caffeine use:         Data from: 02/24/17 Enc Dept: [facility]   Lives with husband   Caffeine use: 600cc/day   Right handed      PHYSICAL EXAM  There were no vitals filed for this visit. There is no height or weight on file to calculate BMI.  Generalized: Well developed, moderately obese in no acute distress  Head: normocephalic and atraumatic,. Oropharynx benign  Neck: Supple,  Musculoskeletal: No deformity   Neurological examination   Mentation: Alert oriented to time, place, history taking. Attention span and concentration appropriate. Recent and remote memory intact.  Follows all commands speech and language fluent.   Cranial nerve II-XII: Pupils were equal round reactive to light extraocular movements were full, visual field were full on confrontational test. Facial sensation and strength were normal. hearing was intact to finger rubbing bilaterally. Uvula tongue midline. head turning and shoulder shrug were normal and symmetric.Tongue protrusion into cheek strength was normal. Motor: normal bulk and tone, full strength in the BUE, BLE, fine finger movements normal, no  pronator drift. No focal weakness Sensory: normal and symmetric to light touch, pinprick, and  Vibration, in the upper and lower extremities Coordination: finger-nose-finger, heel-to-shin bilaterally, no dysmetria Reflexes: Brachioradialis 2/2, biceps 2/2, triceps 2/2, patellar 2/2, Achilles 2/2, plantar responses were flexor bilaterally. Gait and Station: Rising up from seated position without assistance, normal stance,  moderate stride, good arm swing, smooth turning, able to perform tiptoe, and heel walking without difficulty. Tandem gait is steady  DIAGNOSTIC DATA (LABS, IMAGING, TESTING) - I reviewed patient records, labs, notes, testing and imaging myself where available.  Lab Results  Component Value Date   WBC 13.0 (H) 02/25/2016   HGB 14.0 02/25/2016   HCT 40.9 02/25/2016   MCV 81.8 02/25/2016   PLT 320 02/25/2016      Component Value Date/Time   NA 141 02/25/2016 2149   K 3.8 02/25/2016 2149   CL 108 02/25/2016 2149   CO2 25 02/25/2016 2149   GLUCOSE 123 (H) 02/25/2016 2149   BUN 11 02/25/2016 2149   CREATININE 0.86 02/25/2016 2149   CALCIUM 9.2 02/25/2016 2149   PROT 8.0 02/25/2016 2149   ALBUMIN 4.6 02/25/2016 2149   AST 30 02/25/2016 2149   ALT 24 02/25/2016 2149   ALKPHOS 70 02/25/2016 2149   BILITOT 0.6 02/25/2016 2149   GFRNONAA >60 02/25/2016 2149   GFRAA >60 02/25/2016 2149    ASSESSMENT AND PLAN  22 y.o. year old female  has a past medical history of Anxiety, Asthma, BPV (benign positional vertigo), Depression, GERD (gastroesophageal reflux disease), Female-to-female transgender person, Migraine, and Migraine headache (02/24/2017). here to follow-up for her migraine headaches which are also associated with vertigo.  Since being placed on Topamax she has had 3 headaches in 3 months.  This is a great improvement from 4 times a week previously    PLAN: Topamax 75 mg will refill Continue Maxalt acutely Keep a record of your headaches if they worsen Follow-up in  6 months Nilda Riggs, [facility], [facility], APRN  [facility]  [address], [address], [place] [address] [phone]

## 2017-11-27 ENCOUNTER — Ambulatory Visit: Payer: 59 | Admitting: Nurse Practitioner

## 2017-11-28 ENCOUNTER — Encounter: Payer: Self-pay | Admitting: Nurse Practitioner

## 2017-12-13 ENCOUNTER — Ambulatory Visit (INDEPENDENT_AMBULATORY_CARE_PROVIDER_SITE_OTHER): Payer: BLUE CROSS/BLUE SHIELD | Admitting: Endocrinology

## 2017-12-13 ENCOUNTER — Encounter: Payer: Self-pay | Admitting: Endocrinology

## 2017-12-13 VITALS — BP 140/86 | HR 83 | Ht 71.5 in | Wt 264.4 lb

## 2017-12-13 DIAGNOSIS — R630 Anorexia: Secondary | ICD-10-CM | POA: Diagnosis not present

## 2017-12-13 DIAGNOSIS — F640 Transsexualism: Secondary | ICD-10-CM

## 2017-12-13 DIAGNOSIS — Z789 Other specified health status: Secondary | ICD-10-CM

## 2017-12-13 LAB — TSH: TSH: 2.06 u[IU]/mL (ref 0.35–4.50)

## 2017-12-13 NOTE — Progress Notes (Signed)
Subjective:    Patient ID: Samantha Watkins, female    DOB: 07/03/1995, 22 y.o.   MRN: [id number]  HPI Pt returns for f/u of transgender state (M to F; she first noted at age 22; she has been to counseling (and was told she needed to come back only PRN), but has not had surgery; she has been on estradiol since early 2017; no h/o DVT; she reported weight gain, but ON dex test was normal; she declines orchiectomy for now; husband is TG, F to M).  She says breast growth continues to improve.  Facial hair continues to improve, also (she says prob due to laser rx).   Past Medical History:  Diagnosis Date  . Anxiety   . Asthma    childhood  . BPV (benign positional vertigo)   . Depression   . GERD (gastroesophageal reflux disease)   . Female-to-female transgender person   . Migraine   . Migraine headache 02/24/2017    Past Surgical History:  Procedure Laterality Date  . ADENOIDECTOMY    . TONSILLECTOMY      Social History   Socioeconomic History  . Marital status: Married    Spouse name: Samuel BoucheLucas  . Number of children: 0  . Years of education: College  . Highest education level: Not on file  Occupational History  . Occupation: Oceanographerursing school student  . 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 Financial resource strain: Not on file  . Food insecurity:    Worry: Not on file    Inability: Not on file  . Transportation needs:    Medical: Not on file    Non-medical: Not on file  Tobacco Use  . Smoking status: Former Games developermoker  . Smokeless tobacco: Never Used  Substance and Sexual Activity  . Alcohol use: No  . Drug use: No  . Sexual activity: Not on file  Lifestyle  . Physical activity:    Days per week: Not on file    Minutes per session: Not on file  . Stress: Not on file  Relationships  . Social connections:    Talks on phone: Not on file    Gets together: Not on file    Attends religious service: Not on file    Active member of club or organization:  Not on file    Attends meetings of clubs or organizations: Not on file    Relationship status: Not on file  . Intimate partner violence:    Fear of current or ex partner: Not on file    Emotionally abused: Not on file    Physically abused: Not on file    Forced sexual activity: Not on file  Other Topics Concern  . Not on file  Social History Narrative    Merged History Encounter         Data from: 02/23/17 [facility]   Lives   Caffeine use:         Data from: 02/24/17 [facility]   Lives with husband   Caffeine use: 600cc/day   Right handed     Current Outpatient Medications on File Prior to Visit  Medication Sig Dispense Refill  . estradiol (ESTRACE) 2 MG tablet Take 2 mg by mouth daily.    . finasteride (PROSCAR) 5 MG tablet Take 1 tablet (5 mg total) by mouth daily. 30 tablet 1  . ibuprofen (ADVIL,MOTRIN) 200 MG tablet Take 200 mg by mouth every 6 (six) hours  as needed.    . meclizine (ANTIVERT) 25 MG tablet Take 1 tablet (25 mg total) by mouth 3 (three) times daily as needed for dizziness. 30 tablet 0  . norethindrone (MICRONOR,CAMILA,ERRIN) 0.35 MG tablet Take 1 tablet (0.35 mg total) by mouth daily. 3 Package 0  . omeprazole (PRILOSEC) 20 MG capsule Take 1 capsule (20 mg total) by mouth daily. 30 capsule 0  . ondansetron (ZOFRAN ODT) 8 MG disintegrating tablet 8mg  ODT q4 hours prn nausea 4 tablet 0  . polyethylene glycol powder (GLYCOLAX/MIRALAX) powder Take 17 g by mouth 2 (two) times daily. (Patient taking differently: Take 17 g by mouth daily. ) 255 g 0  . rizatriptan (MAXALT) 10 MG tablet Take 1 tablet (10 mg total) by mouth 3 (three) times daily as needed for migraine. 10 tablet 3  . simethicone (MYLICON) 80 MG chewable tablet Chew 80 mg by mouth every 6 (six) hours as needed for flatulence.    . topiramate (TOPAMAX) 25 MG tablet Take  3 tablets at night. 90 tablet 6   No current facility-administered medications on file prior to visit.      Allergies  Allergen Reactions  . Other     Family History  Problem Relation Age of Onset  . GER disease Mother   . Asthma Mother   . Sleep apnea Mother   . Fibromyalgia Mother   . Irritable bowel syndrome Mother   . GER disease Father   . Narcolepsy Father   . Sleep apnea Father   . Arthritis Father   . Asthma Sister   . Asthma Brother   . Heart attack Maternal Grandmother   . Chronic Renal Failure Paternal Grandfather     BP 140/86 (BP Location: Right Arm, Patient Position: Sitting, Cuff Size: Normal)   Pulse 83   Ht 5' 11.5" (1.816 m)   Wt 264 lb 6.4 oz (119.9 kg)   SpO2 98%   BMI 36.36 kg/m    Review of Systems Denies leg edema    Objective:   Physical Exam VITAL SIGNS:  See vs page GENERAL: no distress Face: no terminal hair is noted       Assessment & Plan:  Transgender state.  We discussed. She declines genital surgery, at least for now.   Weight gain: check TSH  Patient Instructions  Please continue the same medications.   blood tests are requested for you today.  We'll let you know about the results. Please return in 1 year.

## 2017-12-13 NOTE — Patient Instructions (Addendum)
Please continue the same medications.   blood tests are requested for you today.  We'll let you know about the results. Please return in 1 year.

## 2017-12-15 LAB — TESTOSTERONE,FREE AND TOTAL
Testosterone, Free: 3.1 pg/mL (ref 0.0–4.2)
Testosterone: 105 ng/dL — ABNORMAL HIGH (ref 8–48)

## 2017-12-19 LAB — ESTRADIOL, FREE
ESTRADIOL: 61 pg/mL
Estradiol, Free: 1.23 pg/mL

## 2018-01-21 ENCOUNTER — Other Ambulatory Visit: Payer: Self-pay | Admitting: Endocrinology

## 2018-02-17 ENCOUNTER — Other Ambulatory Visit: Payer: Self-pay | Admitting: Endocrinology

## 2018-03-03 ENCOUNTER — Other Ambulatory Visit: Payer: Self-pay | Admitting: Nurse Practitioner

## 2018-03-08 ENCOUNTER — Other Ambulatory Visit: Payer: Self-pay | Admitting: Neurology

## 2018-03-23 ENCOUNTER — Other Ambulatory Visit: Payer: Self-pay | Admitting: Endocrinology

## 2018-04-16 ENCOUNTER — Other Ambulatory Visit: Payer: Self-pay | Admitting: Endocrinology

## 2018-04-19 ENCOUNTER — Other Ambulatory Visit: Payer: Self-pay | Admitting: Endocrinology

## 2018-04-19 ENCOUNTER — Other Ambulatory Visit: Payer: Self-pay | Admitting: Nurse Practitioner

## 2018-04-25 ENCOUNTER — Other Ambulatory Visit: Payer: Self-pay | Admitting: Nurse Practitioner

## 2018-06-11 ENCOUNTER — Other Ambulatory Visit: Payer: Self-pay | Admitting: *Deleted

## 2018-06-13 ENCOUNTER — Other Ambulatory Visit: Payer: Self-pay | Admitting: *Deleted

## 2018-06-13 MED ORDER — TOPIRAMATE 25 MG PO TABS: ORAL_TABLET | ORAL | 0 refills | Status: AC

## 2018-06-13 MED ORDER — RIZATRIPTAN BENZOATE 10 MG PO TABS
ORAL_TABLET | ORAL | 0 refills | Status: AC
Start: 2018-06-13 — End: ?

## 2018-06-14 ENCOUNTER — Other Ambulatory Visit: Payer: Self-pay

## 2018-06-14 DIAGNOSIS — F640 Transsexualism: Secondary | ICD-10-CM

## 2018-06-14 DIAGNOSIS — Z789 Other specified health status: Secondary | ICD-10-CM

## 2018-06-14 MED ORDER — ESTRADIOL 2 MG PO TABS: 2.0000 mg | ORAL_TABLET | Freq: Every day | ORAL | 2 refills | Status: DC

## 2018-06-14 MED ORDER — FINASTERIDE 5 MG PO TABS: 5.0000 mg | ORAL_TABLET | Freq: Every day | ORAL | 0 refills | Status: DC

## 2018-06-27 ENCOUNTER — Other Ambulatory Visit: Payer: Self-pay | Admitting: Endocrinology

## 2018-06-27 DIAGNOSIS — Z789 Other specified health status: Secondary | ICD-10-CM

## 2018-06-27 DIAGNOSIS — F640 Transsexualism: Secondary | ICD-10-CM

## 2018-06-28 ENCOUNTER — Other Ambulatory Visit: Payer: Self-pay

## 2018-06-28 DIAGNOSIS — Z789 Other specified health status: Secondary | ICD-10-CM

## 2018-06-28 DIAGNOSIS — F640 Transsexualism: Secondary | ICD-10-CM

## 2018-06-28 MED ORDER — ESTRADIOL 2 MG PO TABS: 2.0000 mg | ORAL_TABLET | Freq: Every day | ORAL | 1 refills | Status: DC

## 2018-06-28 MED ORDER — FINASTERIDE 5 MG PO TABS: 5.0000 mg | ORAL_TABLET | Freq: Every day | ORAL | 1 refills | Status: DC

## 2018-07-06 ENCOUNTER — Other Ambulatory Visit: Payer: Self-pay | Admitting: Nurse Practitioner

## 2018-08-07 ENCOUNTER — Telehealth: Payer: Self-pay | Admitting: *Deleted

## 2018-08-07 NOTE — Telephone Encounter (Signed)
I called pt.  Relayed that received [facility] from [facility] about refilling her topamax 75mg  po qhs.  She stated she was still taking it, although last prescription was 2/20 for once month.  No one else prescribes.  Her email is [email or web address]. Due to current COVID 19 pandemic, our office is severely reducing in office visits for at least the next 2 weeks, in order to minimize the risk to our patients and healthcare providers.  Pt understands that although there may be some limitations with this type of visit, we will take all precautions to reduce any security or privacy concerns.  Pt understands that this will be treated like an in office visit and we will file with pt's insurance. Pt understands that the [employer] webex software must be downloaded and operational on the device pt plans to use for the visit.   She did consent to VV with Shawnie Dapper, NP.  She was at work and not able to make appt, but will f/u tomorrow about appt and updating chart.

## 2018-08-08 ENCOUNTER — Encounter: Payer: Self-pay | Admitting: *Deleted

## 2018-08-08 NOTE — Telephone Encounter (Signed)
Called pt. Updated chart.  Made appt for 08-09-18 at 1530 VV/webex.

## 2018-08-09 ENCOUNTER — Ambulatory Visit (INDEPENDENT_AMBULATORY_CARE_PROVIDER_SITE_OTHER): Payer: Self-pay | Admitting: Family Medicine

## 2018-08-09 ENCOUNTER — Other Ambulatory Visit: Payer: Self-pay

## 2018-08-09 ENCOUNTER — Telehealth: Payer: Self-pay | Admitting: Family Medicine

## 2018-08-09 DIAGNOSIS — Z0289 Encounter for other administrative examinations: Secondary | ICD-10-CM

## 2018-08-09 NOTE — Telephone Encounter (Signed)
Attempt was made to contact patient at 245 by front desk.  Attempt was unsuccessful.  I have attempted to call Samantha Watkins for our 330 appointment with no success.  I have attempted to log and via WebEx meeting but she is not available.  I have left a voicemail asking her to call me back to reschedule.

## 2018-08-09 NOTE — Progress Notes (Unsigned)
PATIENT: Roxan DieselSkylar Rayne Kettering DOB: 12/19/1995  REASON FOR VISIT: follow up HISTORY FROM: patient  Virtual Visit via Telephone Note  I connected with Roxan DieselSkylar Rayne Vlachos on 08/09/18 at  3:30 PM EDT by telephone and verified that I am speaking with the correct person using two identifiers.   I discussed the limitations, risks, security and privacy concerns of performing an evaluation and management service by telephone and the availability of in person appointments. I also discussed with the patient that there may be a patient responsible charge related to this service. The patient expressed understanding and agreed to proceed.   History of Present Illness:  08/09/18 Rosi Anne FuRayne [name] is a 23 y.o. female here today for follow up.    HISTORY [facility]  UPDATE 2/5/2019CM [name], 23 year old returns for follow-up with history of migraine headaches.  Her headaches usually begin in the temporal and frontal areas and behind the eyes making it difficult to focus or concentrate.  The patient is transgender going from female to female and has been on hormone therapy  for over 2 years. Once the headache began the patient may have vertigo associated with some nausea when last seen patient was having 4 headaches per week.  She was placed on Topamax by Dr. Anne HahnWillis.  She is currently taking 75 mg at night.  She has had 3 headaches since being on full dose.  She takes Maxalt acutely.  Stress is a big trigger for the patient.  She is currently in nursing school.  She returns for reevaluation  02/24/17 KW Mr.Farnhamis a 23 year old right-handed white female with a history of migraine headaches that have been present off and on over the last 1 year. The patient is a transgender person going from female to female, he has been on hormone therapy for about 2 years. He claims that there has been no change in dosing of medication over the last 2 years. About 1 year ago, he  began having episodes of headaches associated with vertigo. A CT scan of the brain was done on 25 February 2016 and was unremarkable. He claims that the headaches usually begin in the temporal and frontal areas and behind the eyes, he will initially have a fuzzyheaded feeling as if he cannot focus or concentrate. Once the headache begins, the vertigo may start associated with nausea. The patient may have some blurring of vision, no loss of vision or geometric shapes in the vision. He denies any other associated symptoms such as numbness or weakness of the face, arms, or legs. He denies any neck stiffness. The headache episodes may last anywhere from 2 hours to up to 5 hours. The headaches initially were occurring about once a month, but now they are occurring up to 4 times a week. The patient takes Excedrin Migraine, he may take Zofran and meclizine for the dizziness. The patient drinks 1 cup of green tea a day, no other caffeinated products are consumed. The patient denies any particular activating factors for the headache. His mother and sister also have migraine. The patient is sent to this office for an evaluation.   Observations/Objective:  Generalized: Well developed, in no acute distress  Mentation: Alert oriented to time, place, history taking. Follows all commands speech and language fluent   Assessment and Plan:  23 y.o. year old female  has a past medical history of Anxiety, Asthma, BPV (benign positional vertigo), Depression, GERD (gastroesophageal reflux disease), Female-to-female transgender person, Migraine, and Migraine headache (  02/24/2017). here with  No diagnosis found.  No orders of the defined types were placed in this encounter.   No orders of the defined types were placed in this encounter.    Follow Up Instructions:  I discussed the assessment and treatment plan with the patient. The patient was provided an opportunity to ask questions and all were answered.  The patient agreed with the plan and demonstrated an understanding of the instructions.   The patient was advised to call back or seek an in-person evaluation if the symptoms worsen or if the condition fails to improve as anticipated.  I provided *** minutes of non-face-to-face time during this encounter.   Shawnie Dapper, NP

## 2018-12-10 ENCOUNTER — Telehealth: Payer: Self-pay | Admitting: Endocrinology

## 2018-12-10 ENCOUNTER — Other Ambulatory Visit: Payer: Self-pay

## 2018-12-10 DIAGNOSIS — F640 Transsexualism: Secondary | ICD-10-CM

## 2018-12-10 DIAGNOSIS — Z789 Other specified health status: Secondary | ICD-10-CM

## 2018-12-10 MED ORDER — ESTRADIOL 2 MG PO TABS: 2.0000 mg | ORAL_TABLET | Freq: Every day | ORAL | 1 refills | Status: DC

## 2018-12-10 MED ORDER — FINASTERIDE 5 MG PO TABS: 5.0000 mg | ORAL_TABLET | Freq: Every day | ORAL | 1 refills | Status: DC

## 2018-12-10 NOTE — Telephone Encounter (Signed)
E-Prescribing Status: Receipt confirmed by pharmacy (12/10/2018 1:18 PM EDT)

## 2018-12-10 NOTE — Telephone Encounter (Signed)
MEDICATION: estradiol (ESTRACE) 2 MG tablet HEATHER 0.35 MG tablet finasteride (PROSCAR) 5 MG tablet  PHARMACY:  [id number] [place], [place]  IS THIS A 90 DAY SUPPLY : YES  IS PATIENT OUT OF MEDICATION:   IF NOT; HOW MUCH IS LEFT:   LAST APPOINTMENT DATE: @3 /07/2018  NEXT APPOINTMENT DATE:@8 /24/2020  DO WE HAVE YOUR PERMISSION TO LEAVE A DETAILED MESSAGE:  OTHER COMMENTS:    **Let patient know to contact pharmacy at the end of the day to make sure medication is ready. **  ** Please notify patient to allow 48-72 hours to process**  **Encourage patient to contact the pharmacy for refills or they can request refills through [facility]**

## 2018-12-17 ENCOUNTER — Ambulatory Visit: Payer: Self-pay | Admitting: Endocrinology

## 2018-12-20 ENCOUNTER — Ambulatory Visit: Payer: Self-pay | Admitting: Endocrinology

## 2018-12-20 ENCOUNTER — Other Ambulatory Visit: Payer: Self-pay

## 2019-03-11 ENCOUNTER — Telehealth: Payer: Self-pay | Admitting: Endocrinology

## 2019-03-11 DIAGNOSIS — F640 Transsexualism: Secondary | ICD-10-CM

## 2019-03-11 DIAGNOSIS — Z789 Other specified health status: Secondary | ICD-10-CM

## 2019-03-11 NOTE — Telephone Encounter (Signed)
Done

## 2019-03-11 NOTE — Telephone Encounter (Signed)
Patient is requesting a referral to a plastic surgeon in order to get augmentation.  [facility] that referral needs to go is [facility], [place] phone # [phone]

## 2019-03-11 NOTE — Telephone Encounter (Signed)
Please advise 

## 2019-04-03 ENCOUNTER — Ambulatory Visit (INDEPENDENT_AMBULATORY_CARE_PROVIDER_SITE_OTHER): Payer: 59 | Admitting: Psychology

## 2019-04-03 DIAGNOSIS — F4323 Adjustment disorder with mixed anxiety and depressed mood: Secondary | ICD-10-CM | POA: Diagnosis not present

## 2019-04-04 ENCOUNTER — Institutional Professional Consult (permissible substitution): Payer: 59 | Admitting: Plastic Surgery

## 2019-04-05 ENCOUNTER — Other Ambulatory Visit: Payer: Self-pay | Admitting: Endocrinology

## 2019-04-05 NOTE — Telephone Encounter (Signed)
Per Dr. Loanne Drilling, unable to refill norethindrone without an appt. Routing this message to the front desk for scheduling purposes.

## 2019-04-05 NOTE — Telephone Encounter (Signed)
1.  Please schedule f/u appt 2.  Then please refill x 1, pending that appt.  

## 2019-04-05 NOTE — Telephone Encounter (Signed)
Please advise 

## 2019-04-11 ENCOUNTER — Other Ambulatory Visit: Payer: Self-pay

## 2019-04-11 ENCOUNTER — Telehealth: Payer: Self-pay

## 2019-04-11 DIAGNOSIS — Z789 Other specified health status: Secondary | ICD-10-CM

## 2019-04-11 DIAGNOSIS — F640 Transsexualism: Secondary | ICD-10-CM

## 2019-04-11 MED ORDER — FINASTERIDE 5 MG PO TABS: 5.0000 mg | ORAL_TABLET | Freq: Every day | ORAL | 1 refills | Status: DC

## 2019-04-11 NOTE — Telephone Encounter (Signed)
MEDICATION: finasteride (PROSCAR) 5 MG tablet  PHARMACY:  [id number] [place] [place] [address]  IS THIS A 90 DAY SUPPLY :   IS PATIENT OUT OF MEDICATION:   IF NOT; HOW MUCH IS LEFT:   LAST APPOINTMENT DATE: @12 /02/2019  NEXT APPOINTMENT DATE:@1 /14/2021  DO WE HAVE YOUR PERMISSION TO LEAVE A DETAILED MESSAGE:  OTHER COMMENTS:    **Let patient know to contact pharmacy at the end of the day to make sure medication is ready. **  ** Please notify patient to allow 48-72 hours to process**  **Encourage patient to contact the pharmacy for refills or they can request refills through [facility]**

## 2019-04-11 NOTE — Telephone Encounter (Signed)
finasteride (PROSCAR) 5 MG tablet 90 tablet 1 04/11/2019    Sig - Route: Take 1 tablet (5 mg total) by mouth daily. - Oral   Sent to pharmacy as: finasteride (PROSCAR) 5 MG tablet   E-Prescribing Status: Receipt confirmed by pharmacy (04/11/2019 10:26 AM EST)

## 2019-04-12 NOTE — Telephone Encounter (Signed)
Patient is scheduled for appointment on 05/09/19 at 11:15 a.m.

## 2019-04-16 ENCOUNTER — Telehealth: Payer: Self-pay

## 2019-04-16 NOTE — Telephone Encounter (Signed)
Pt calling to request refill of Progesterone. Not seeing this medication on pt medication regimen. Routing this message to Dr. Loanne Drilling for him to review and advise

## 2019-04-16 NOTE — Telephone Encounter (Signed)
1.  Please schedule f/u appt 2.  Then please refill x 1, pending that appt.  

## 2019-04-17 ENCOUNTER — Ambulatory Visit: Payer: No Typology Code available for payment source | Admitting: Psychology

## 2019-04-17 ENCOUNTER — Other Ambulatory Visit: Payer: Self-pay

## 2019-04-17 DIAGNOSIS — Z789 Other specified health status: Secondary | ICD-10-CM

## 2019-04-17 DIAGNOSIS — F640 Transsexualism: Secondary | ICD-10-CM

## 2019-04-17 MED ORDER — NORETHINDRONE 0.35 MG PO TABS: 1.0000 | ORAL_TABLET | Freq: Every day | ORAL | 0 refills | Status: DC

## 2019-04-17 NOTE — Telephone Encounter (Signed)
Sorry, it is called "heather"

## 2019-04-17 NOTE — Telephone Encounter (Signed)
Not seeing this medication on pt medication list. Please advise. Pt is scheduled for F/U 05/02/19

## 2019-04-17 NOTE — Telephone Encounter (Signed)
norethindrone (HEATHER) 0.35 MG tablet 28 tablet 0 04/17/2019    Sig - Route: Take 1 tablet (0.35 mg total) by mouth daily. - Oral   Sent to pharmacy as: norethindrone (HEATHER) 0.35 MG tablet   E-Prescribing Status: Receipt confirmed by pharmacy (04/17/2019 10:40 AM EST)

## 2019-04-30 ENCOUNTER — Other Ambulatory Visit: Payer: Self-pay

## 2019-05-02 ENCOUNTER — Other Ambulatory Visit: Payer: Self-pay

## 2019-05-02 ENCOUNTER — Ambulatory Visit: Payer: 59 | Admitting: Endocrinology

## 2019-05-06 ENCOUNTER — Ambulatory Visit: Payer: Managed Care, Other (non HMO) | Admitting: Endocrinology

## 2019-05-07 ENCOUNTER — Other Ambulatory Visit: Payer: Self-pay | Admitting: Endocrinology

## 2019-05-07 DIAGNOSIS — Z789 Other specified health status: Secondary | ICD-10-CM

## 2019-05-07 DIAGNOSIS — F640 Transsexualism: Secondary | ICD-10-CM

## 2019-05-08 ENCOUNTER — Other Ambulatory Visit: Payer: Self-pay

## 2019-05-08 ENCOUNTER — Emergency Department (HOSPITAL_COMMUNITY)
Admission: EM | Admit: 2019-05-08 | Discharge: 2019-05-08 | Disposition: A | Discharge status: Home / Self Care | Payer: No Typology Code available for payment source | Attending: Emergency Medicine | Admitting: Emergency Medicine

## 2019-05-08 DIAGNOSIS — J45909 Unspecified asthma, uncomplicated: Secondary | ICD-10-CM | POA: Diagnosis not present

## 2019-05-08 DIAGNOSIS — Z87891 Personal history of nicotine dependence: Secondary | ICD-10-CM | POA: Insufficient documentation

## 2019-05-08 DIAGNOSIS — Z20822 Contact with and (suspected) exposure to covid-19: Secondary | ICD-10-CM | POA: Diagnosis not present

## 2019-05-08 DIAGNOSIS — B349 Viral infection, unspecified: Secondary | ICD-10-CM | POA: Insufficient documentation

## 2019-05-08 DIAGNOSIS — R110 Nausea: Secondary | ICD-10-CM | POA: Diagnosis present

## 2019-05-08 LAB — SARS CORONAVIRUS 2 (TAT 6-24 HRS): SARS Coronavirus 2: NEGATIVE

## 2019-05-08 NOTE — ED Triage Notes (Signed)
Pt states that she has been having NVD and SHOB since last night.  Pt works at [employer] and was sent here this morning due to her Covid symptoms.

## 2019-05-08 NOTE — ED Provider Notes (Signed)
[facility] Emergency Department Provider Note MRN:  [id number]  Arrival date & time: 05/08/19     Chief Complaint   Nausea, Emesis, and Diarrhea   History of Present Illness   Samantha Watkins is a 24 y.o. year-old female with a history of asthma presenting to the ED with chief complaint of nausea, vomiting, diarrhea.  Patient began having chills last night.  Wearing 4 blankets, still felt cold.  Denies fever though.  This morning endorsing nausea, few episodes of nonbloody nonbilious vomiting, few episodes of nonbloody diarrhea.  Denies abdominal pain, no chest pain, mild shortness of breath this morning.  General malaise, fatigue, aches.  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  Review of Systems  A complete 10 system review of systems was obtained and all systems are negative except as noted in the HPI and PMH.   Patient's Health History    Past Medical History:  Diagnosis Date  . Anxiety   . Asthma    childhood  . BPV (benign positional vertigo)   . Depression   . GERD (gastroesophageal reflux disease)   . Female-to-female transgender person   . Migraine   . Migraine headache 02/24/2017    Past Surgical History:  Procedure Laterality Date  . ADENOIDECTOMY    . TONSILLECTOMY      Family History  Problem Relation Age of Onset  . GER disease Mother   . Asthma Mother   . Sleep apnea Mother   . Fibromyalgia Mother   . Irritable bowel syndrome Mother   . GER disease Father   . Narcolepsy Father   . Sleep apnea Father   . Arthritis Father   . Asthma Sister   . Asthma Brother   . Heart attack Maternal Grandmother   . Chronic Renal Failure Paternal Grandfather     Social History   Socioeconomic History  . Marital status: Married    Spouse name: Linton Rump  . Number of children: 0  . Years of education: College  . Highest education level: Not on file  Occupational History  . Occupation: Teacher, adult education  .  Occupation: [employer]  Tobacco Use  . Smoking status: Former Research scientist (life sciences)  . Smokeless tobacco: Never Used  Substance and Sexual Activity  . Alcohol use: No  . Drug use: No  . Sexual activity: Not on file  Other Topics Concern  . Not on file  Social History Narrative    Merged History Encounter         Data from: 02/23/17 Enc Dept: [facility]   Lives   Caffeine use:         Data from: 02/24/17 Enc Dept: [facility]   Lives with husband   Caffeine use: 600cc/day   Right handed    Social Determinants of Health   Financial Resource Strain:   . Difficulty of Paying Living Expenses: Not on file  Food Insecurity:   . Worried About Charity fundraiser in the Last Year: Not on file  . Ran Out of Food in the Last Year: Not on file  Transportation Needs:   . Lack of Transportation (Medical): Not on file  . Lack of Transportation (Non-Medical): Not on file  Physical Activity:   . Days of Exercise per Week: Not on file  . Minutes of Exercise per Session: Not on file  Stress:   . Feeling of Stress : Not on file  Social Connections:   .  Frequency of Communication with Friends and Family: Not on file  . Frequency of Social Gatherings with Friends and Family: Not on file  . Attends Religious Services: Not on file  . Active Member of Clubs or Organizations: Not on file  . Attends Banker Meetings: Not on file  . Marital Status: Not on file  Intimate Partner Violence:   . Fear of Current or Ex-Partner: Not on file  . Emotionally Abused: Not on file  . Physically Abused: Not on file  . Sexually Abused: Not on file     Physical Exam  Vital Signs and Nursing Notes reviewed Vitals:   05/08/19 0702  BP: 138/88  Pulse: 86  Resp: 18  Temp: 97.9 F (36.6 C)  SpO2: 100%    CONSTITUTIONAL: Well-appearing, NAD NEURO:  Alert and oriented x 3, no focal deficits EYES:  eyes equal and reactive ENT/NECK:  no LAD, no JVD CARDIO: Regular rate,  well-perfused, normal S1 and S2 PULM:  CTAB no wheezing or rhonchi GI/GU:  normal bowel sounds, non-distended, non-tender MSK/SPINE:  No gross deformities, no edema SKIN:  no rash, atraumatic PSYCH:  Appropriate speech and behavior  Diagnostic and Interventional Summary    EKG Interpretation  Date/Time:    Ventricular Rate:    PR Interval:    QRS Duration:   QT Interval:    QTC Calculation:   R Axis:     Text Interpretation:        Labs Reviewed  SARS CORONAVIRUS 2 (TAT 6-24 HRS)    No orders to display    Medications - No data to display   Procedures  /  Critical Care Procedures  ED Course and Medical Decision Making  I have reviewed the triage vital signs, the nursing notes, and pertinent available records from the EMR.  Pertinent labs & imaging results that were available during my care of the patient were reviewed by me and considered in my medical decision making (see below for details).     Well-appearing, normal vital signs, no hypoxia, no increased work of breathing, lungs are clear to auscultation, abdominal exam is completely benign.  Suspect underlying viral illness, possibly coronavirus, appropriate for home quarantine and discharge.  No indication for further testing here in the emergency department, strict return precautions.  Samantha Watkins was evaluated in Emergency Department on 05/08/2019 for the symptoms described in the history of present illness. She was evaluated in the context of the global COVID-19 pandemic, which necessitated consideration that the patient might be at risk for infection with the SARS-CoV-2 virus that causes COVID-19. Institutional protocols and algorithms that pertain to the evaluation of patients at risk for COVID-19 are in a state of rapid change based on information released by regulatory bodies including the CDC and federal and state organizations. These policies and algorithms were followed during the patient's care in the  ED.     Elmer Sow. Pilar Plate, MD [facility] Emergency Medicine [facility] [email or web address]  Final Clinical Impressions(s) / ED Diagnoses     ICD-10-CM   1. Viral illness  B34.9     ED Discharge Orders    None       Discharge Instructions Discussed with and Provided to Patient:     Discharge Instructions     You were evaluated in the Emergency Department and after careful evaluation, we did not find any emergent condition requiring admission or further testing in the hospital.  Your exam/testing today is overall reassuring.  Your symptoms seem to be due to a viral illness, possibly the coronavirus.  We have tested you for the coronavirus here in the Emergency Department.  Please isolate or quarantine at home until you receive a negative test result.  If positive, we recommend continued home quarantine per [facility] recommendations.   Please return to the Emergency Department if you experience any worsening of your condition.  We encourage you to follow up with a primary care provider.  Thank you for allowing [place] to be a part of your care.      Sabas Sous, MD 05/08/19 [phone]

## 2019-05-08 NOTE — Discharge Instructions (Signed)
You were evaluated in the Emergency Department and after careful evaluation, we did not find any emergent condition requiring admission or further testing in the hospital.  Your exam/testing today is overall reassuring.  Your symptoms seem to be due to a viral illness, possibly the coronavirus.  We have tested you for the coronavirus here in the Emergency Department.  Please isolate or quarantine at home until you receive a negative test result.  If positive, we recommend continued home quarantine per CDC recommendations.   Please return to the Emergency Department if you experience any worsening of your condition.  We encourage you to follow up with a primary care provider.  Thank you for allowing us to be a part of your care. 

## 2019-05-09 ENCOUNTER — Ambulatory Visit: Payer: 59 | Admitting: Endocrinology

## 2019-05-11 ENCOUNTER — Other Ambulatory Visit: Payer: Self-pay | Admitting: Endocrinology

## 2019-05-11 DIAGNOSIS — Z789 Other specified health status: Secondary | ICD-10-CM

## 2019-05-11 DIAGNOSIS — F640 Transsexualism: Secondary | ICD-10-CM

## 2019-05-11 NOTE — Telephone Encounter (Signed)
1.  Please schedule f/u appt 2.  Then please refill x 1, pending that appt.  

## 2019-05-13 NOTE — Telephone Encounter (Signed)
Attempted to call and schedule f/u visit. Pt did not answer and voicemail was left for pt to return call and schedule.

## 2019-05-14 NOTE — Telephone Encounter (Signed)
Unable to reach patient left a voice mail requesting patient call back to schedule f/u

## 2019-05-14 NOTE — Telephone Encounter (Signed)
Per Dr. Everardo All, unable to refill Micronor without an appt. Routing this message to the front desk for scheduling purposes.

## 2019-05-17 NOTE — Telephone Encounter (Signed)
LMTCB to schedule f/u

## 2019-05-23 ENCOUNTER — Other Ambulatory Visit: Payer: Self-pay

## 2019-05-23 ENCOUNTER — Telehealth: Payer: Self-pay | Admitting: Endocrinology

## 2019-05-23 DIAGNOSIS — F640 Transsexualism: Secondary | ICD-10-CM

## 2019-05-23 DIAGNOSIS — Z789 Other specified health status: Secondary | ICD-10-CM

## 2019-05-23 MED ORDER — NORETHINDRONE 0.35 MG PO TABS: 1.0000 | ORAL_TABLET | Freq: Every day | ORAL | 0 refills | Status: DC

## 2019-05-23 NOTE — Telephone Encounter (Signed)
Patient scheduled 06/03/19 at 2:15-FYI

## 2019-05-23 NOTE — Telephone Encounter (Signed)
Outpatient Medication Detail   Disp Refills Start End   norethindrone (HEATHER) 0.35 MG tablet 14 tablet 0 05/23/2019    Sig - Route: Take 1 tablet (0.35 mg total) by mouth daily. OVERDUE FOR AN APPT. WILL PROVIDE 14 DAY SUPPLY UNTIL FOLLOW UP APPT - Oral   Sent to pharmacy as: norethindrone (HEATHER) 0.35 MG tablet   E-Prescribing Status: Receipt confirmed by pharmacy (05/23/2019  9:10 AM EST)

## 2019-05-23 NOTE — Telephone Encounter (Signed)
Robin-Pharmacist from [facility] Ph# [phone] called re: Received RX for birth control will have to be given a full pack-unable to fill a 14 day RX for birth control. Patient will be given full pack of 28 tablets unless Pharmacy is notified to cancel the above RX.

## 2019-06-03 ENCOUNTER — Ambulatory Visit: Payer: No Typology Code available for payment source | Admitting: Endocrinology

## 2019-06-03 DIAGNOSIS — Z0289 Encounter for other administrative examinations: Secondary | ICD-10-CM

## 2019-06-12 ENCOUNTER — Other Ambulatory Visit: Payer: Self-pay | Admitting: Endocrinology

## 2019-06-12 DIAGNOSIS — Z789 Other specified health status: Secondary | ICD-10-CM

## 2019-06-12 DIAGNOSIS — F640 Transsexualism: Secondary | ICD-10-CM

## 2019-06-13 ENCOUNTER — Other Ambulatory Visit: Payer: Self-pay | Admitting: Endocrinology

## 2019-06-13 DIAGNOSIS — Z789 Other specified health status: Secondary | ICD-10-CM

## 2019-06-13 DIAGNOSIS — F640 Transsexualism: Secondary | ICD-10-CM

## 2019-06-15 ENCOUNTER — Other Ambulatory Visit: Payer: Self-pay | Admitting: Endocrinology

## 2019-06-15 DIAGNOSIS — Z789 Other specified health status: Secondary | ICD-10-CM

## 2019-06-15 DIAGNOSIS — F640 Transsexualism: Secondary | ICD-10-CM

## 2019-06-15 NOTE — Telephone Encounter (Signed)
1.  Please schedule f/u appt 2.  Then please refill x 1, pending that appt.  

## 2019-06-20 ENCOUNTER — Other Ambulatory Visit: Payer: Self-pay

## 2019-06-24 ENCOUNTER — Telehealth: Payer: Self-pay

## 2019-06-24 ENCOUNTER — Ambulatory Visit: Payer: No Typology Code available for payment source | Admitting: Endocrinology

## 2019-06-24 NOTE — Telephone Encounter (Signed)
Given pt failure to show again for 06/24/19 appt. Routing this message to Dr. Everardo All for him to review and advise.

## 2019-06-24 NOTE — Telephone Encounter (Signed)
please contact patient: If no-shows continue, she will be d/c from practice

## 2019-06-25 NOTE — Telephone Encounter (Signed)
Pt has been contacted. Made her aware of the no show policy and that if she misses one more appt she will be dismissed. She understood and has rescheduled for 07/05/19

## 2019-06-27 ENCOUNTER — Telehealth: Payer: Self-pay | Admitting: Endocrinology

## 2019-06-27 NOTE — Telephone Encounter (Signed)
Melita from [facility] called stating that they did receive referral and Crystal will be contacting patient as soon as it is reviewed.

## 2019-07-03 ENCOUNTER — Other Ambulatory Visit: Payer: Self-pay

## 2019-07-05 ENCOUNTER — Other Ambulatory Visit: Payer: Self-pay

## 2019-07-05 ENCOUNTER — Ambulatory Visit (INDEPENDENT_AMBULATORY_CARE_PROVIDER_SITE_OTHER): Payer: 59 | Admitting: Endocrinology

## 2019-07-05 ENCOUNTER — Encounter: Payer: Self-pay | Admitting: Endocrinology

## 2019-07-05 VITALS — BP 100/62 | HR 71 | Ht 71.5 in | Wt 271.6 lb

## 2019-07-05 DIAGNOSIS — Z789 Other specified health status: Secondary | ICD-10-CM

## 2019-07-05 DIAGNOSIS — F640 Transsexualism: Secondary | ICD-10-CM

## 2019-07-05 LAB — CBC WITH DIFFERENTIAL/PLATELET
Basophils Absolute: 0.1 10*3/uL (ref 0.0–0.1)
Basophils Relative: 0.9 % (ref 0.0–3.0)
Eosinophils Absolute: 0.1 10*3/uL (ref 0.0–0.7)
Eosinophils Relative: 1 % (ref 0.0–5.0)
HCT: 43.8 % (ref 36.0–46.0)
Hemoglobin: 14.4 g/dL (ref 12.0–15.0)
Lymphocytes Relative: 18.1 % (ref 12.0–46.0)
Lymphs Abs: 1.8 10*3/uL (ref 0.7–4.0)
MCHC: 33 g/dL (ref 30.0–36.0)
MCV: 83.4 fl (ref 78.0–100.0)
Monocytes Absolute: 0.6 10*3/uL (ref 0.1–1.0)
Monocytes Relative: 6.1 % (ref 3.0–12.0)
Neutro Abs: 7.4 10*3/uL (ref 1.4–7.7)
Neutrophils Relative %: 73.9 % (ref 43.0–77.0)
Platelets: 348 10*3/uL (ref 150.0–400.0)
RBC: 5.25 Mil/uL — ABNORMAL HIGH (ref 3.87–5.11)
RDW: 13.1 % (ref 11.5–15.5)
WBC: 10 10*3/uL (ref 4.0–10.5)

## 2019-07-05 LAB — BASIC METABOLIC PANEL
BUN: 11 mg/dL (ref 6–23)
CO2: 26 mEq/L (ref 19–32)
Calcium: 9.3 mg/dL (ref 8.4–10.5)
Chloride: 104 mEq/L (ref 96–112)
Creatinine, Ser: 0.76 mg/dL (ref 0.40–1.20)
GFR: 94.1 mL/min (ref >60.00)
Glucose, Bld: 89 mg/dL (ref 70–99)
Potassium: 3.8 mEq/L (ref 3.5–5.1)
Sodium: 138 mEq/L (ref 135–145)

## 2019-07-05 LAB — TSH: TSH: 2.34 u[IU]/mL (ref 0.35–4.50)

## 2019-07-05 NOTE — Progress Notes (Addendum)
Subjective:    Patient ID: Samantha Watkins, female    DOB: 1995/06/28, 24 y.o.   MRN: [id number]  HPI  Pt returns for f/u of transgender state (M to F):  Surgery: never Medication: estradiol since early 2017 Counseling: she was told she needed to come back only PRN Other: she declines orchiectomy for now; husband is TG, F to M; she reported weight gain, but ON dex test was normal Interval hx: She continues have to improvement in secondary sex characteristics Past Medical History:  Diagnosis Date  . Anxiety   . Asthma    childhood  . BPV (benign positional vertigo)   . Depression   . GERD (gastroesophageal reflux disease)   . Female-to-female transgender person   . Migraine   . Migraine headache 02/24/2017    Past Surgical History:  Procedure Laterality Date  . ADENOIDECTOMY    . TONSILLECTOMY      Social History   Socioeconomic History  . Marital status: Married    Spouse name: Linton Rump  . Number of children: 0  . Years of education: College  . Highest education level: Not on file  Occupational History  . Occupation: Teacher, adult education  . Occupation: [employer]  Tobacco Use  . Smoking status: Former Research scientist (life sciences)  . Smokeless tobacco: Never Used  Substance and Sexual Activity  . Alcohol use: No  . Drug use: No  . Sexual activity: Not on file  Other Topics Concern  . Not on file  Social History Narrative    Merged History Encounter         Data from: 02/23/17 Enc Dept: [facility]   Lives   Caffeine use:         Data from: 02/24/17 Enc Dept: [facility]   Lives with husband   Caffeine use: 600cc/day   Right handed    Social Determinants of Health   Financial Resource Strain:   . Difficulty of Paying Living Expenses:   Food Insecurity:   . Worried About Charity fundraiser in the Last Year:   . Arboriculturist in the Last Year:   Transportation Needs:   . Film/video editor (Medical):   Marland Kitchen Lack of Transportation  (Non-Medical):   Physical Activity:   . Days of Exercise per Week:   . Minutes of Exercise per Session:   Stress:   . Feeling of Stress :   Social Connections:   . Frequency of Communication with Friends and Family:   . Frequency of Social Gatherings with Friends and Family:   . Attends Religious Services:   . Active Member of Clubs or Organizations:   . Attends Archivist Meetings:   Marland Kitchen Marital Status:   Intimate Partner Violence:   . Fear of Current or Ex-Partner:   . Emotionally Abused:   Marland Kitchen Physically Abused:   . Sexually Abused:     Current Outpatient Medications on File Prior to Visit  Medication Sig Dispense Refill  . estradiol (ESTRACE) 2 MG tablet Take 1 tablet (2 mg total) by mouth daily. 90 tablet 1  . finasteride (PROSCAR) 5 MG tablet Take 1 tablet (5 mg total) by mouth daily. 90 tablet 1  . ibuprofen (ADVIL,MOTRIN) 200 MG tablet Take 200 mg by mouth every 6 (six) hours as needed.    . meclizine (ANTIVERT) 25 MG tablet Take 1 tablet (25 mg total) by mouth 3 (three) times daily as needed for dizziness. 30 tablet 0  .  norethindrone (HEATHER) 0.35 MG tablet Take 1 tablet (0.35 mg total) by mouth daily. OVERDUE FOR AN APPT. WILL PROVIDE 14 DAY SUPPLY UNTIL FOLLOW UP APPT 14 tablet 0  . omeprazole (PRILOSEC) 20 MG capsule Take 1 capsule (20 mg total) by mouth daily. 30 capsule 0  . ondansetron (ZOFRAN ODT) 8 MG disintegrating tablet 8mg  ODT q4 hours prn nausea 4 tablet 0  . polyethylene glycol powder (GLYCOLAX/MIRALAX) powder Take 17 g by mouth 2 (two) times daily. (Patient taking differently: Take 17 g by mouth daily. ) 255 g 0  . rizatriptan (MAXALT) 10 MG tablet Take one tablet onset of migraine and may repeat in 2 hours if needed.  Max 2 in 24 hours. PLEASE CALL FOR APPOINTMENT FOR FUTURE REFILLS. 10 tablet 0  . simethicone (MYLICON) 80 MG chewable tablet Chew 80 mg by mouth every 6 (six) hours as needed for flatulence.    . topiramate (TOPAMAX) 25 MG tablet Take  3  tablets at night.  CALL FOR APPOINTMENT FOR FUTURE REFILLS. 90 tablet 0   No current facility-administered medications on file prior to visit.    No Active Allergies  Family History  Problem Relation Age of Onset  . GER disease Mother   . Asthma Mother   . Sleep apnea Mother   . Fibromyalgia Mother   . Irritable bowel syndrome Mother   . GER disease Father   . Narcolepsy Father   . Sleep apnea Father   . Arthritis Father   . Asthma Sister   . Asthma Brother   . Heart attack Maternal Grandmother   . Chronic Renal Failure Paternal Grandfather     BP 100/62   Pulse 71   Ht 5' 11.5" (1.816 m)   Wt 271 lb 9.6 oz (123.2 kg)   SpO2 98%   BMI 37.35 kg/m    Review of Systems     Objective:   Physical Exam VITAL SIGNS:  See vs page GENERAL: no distress Ext: no leg edema      Assessment & Plan:  Transgender state: due for recheck.   Patient Instructions  Please continue the same medications.   blood tests are requested for you today.  We'll let you know about the results. Please see a specialist.  you will receive a phone call, about a day and time for an appointment.   Please return in 1 year.

## 2019-07-05 NOTE — Patient Instructions (Addendum)
Please continue the same medications.   blood tests are requested for you today.  We'll let you know about the results. Please see a specialist.  you will receive a phone call, about a day and time for an appointment.   Please return in 1 year.

## 2019-07-11 LAB — ESTRADIOL, FREE
Estradiol, Free: 1.02 pg/mL
Estradiol: 44 pg/mL

## 2019-07-13 LAB — TESTOSTERONE,FREE AND TOTAL
Testosterone, Free: 1.4 pg/mL (ref 0.0–4.2)
Testosterone: 121 ng/dL — ABNORMAL HIGH (ref 8–48)

## 2019-07-16 ENCOUNTER — Other Ambulatory Visit: Payer: Self-pay | Admitting: Endocrinology

## 2019-07-16 ENCOUNTER — Encounter: Payer: Self-pay | Admitting: Endocrinology

## 2019-07-16 MED ORDER — DUTASTERIDE 0.5 MG PO CAPS: 0.5000 mg | ORAL_CAPSULE | Freq: Every day | ORAL | 3 refills | Status: AC

## 2019-07-16 MED ORDER — ESTRADIOL 1 MG PO TABS: 3.0000 mg | ORAL_TABLET | Freq: Every day | ORAL | 3 refills | Status: AC

## 2019-08-09 ENCOUNTER — Other Ambulatory Visit: Payer: Self-pay | Admitting: Endocrinology

## 2019-08-09 ENCOUNTER — Other Ambulatory Visit: Payer: Self-pay

## 2019-08-09 DIAGNOSIS — F640 Transsexualism: Secondary | ICD-10-CM

## 2019-08-09 DIAGNOSIS — Z789 Other specified health status: Secondary | ICD-10-CM

## 2019-08-09 MED ORDER — NORETHINDRONE 0.35 MG PO TABS: 1.0000 | ORAL_TABLET | Freq: Every day | ORAL | 11 refills | Status: AC

## 2019-11-11 ENCOUNTER — Other Ambulatory Visit: Payer: Self-pay | Admitting: Endocrinology

## 2019-11-11 DIAGNOSIS — Z789 Other specified health status: Secondary | ICD-10-CM

## 2020-01-05 ENCOUNTER — Emergency Department (HOSPITAL_COMMUNITY)
Admission: EM | Admit: 2020-01-05 | Discharge: 2020-01-05 | Disposition: A | Discharge status: Home / Self Care | Payer: No Typology Code available for payment source | Attending: Emergency Medicine | Admitting: Emergency Medicine

## 2020-01-05 ENCOUNTER — Other Ambulatory Visit: Payer: Self-pay

## 2020-01-05 ENCOUNTER — Encounter (HOSPITAL_COMMUNITY): Payer: Self-pay | Admitting: Emergency Medicine

## 2020-01-05 DIAGNOSIS — U071 COVID-19: Secondary | ICD-10-CM | POA: Insufficient documentation

## 2020-01-05 DIAGNOSIS — Z5321 Procedure and treatment not carried out due to patient leaving prior to being seen by health care provider: Secondary | ICD-10-CM | POA: Diagnosis not present

## 2020-01-05 DIAGNOSIS — R519 Headache, unspecified: Secondary | ICD-10-CM | POA: Diagnosis present

## 2020-01-05 LAB — SARS CORONAVIRUS 2 BY RT PCR (HOSPITAL ORDER, PERFORMED IN ~~LOC~~ HOSPITAL LAB): SARS Coronavirus 2: POSITIVE — AB

## 2020-01-05 NOTE — ED Triage Notes (Signed)
Pt reports she began to have the COVID symptoms at work tonight including body aches, headaches, fever (101.8), chills and just feeling poorly.  She had a positive rapid but wants to make sure it is correct.  Pt did have her covid vaccine back in March 2021

## 2020-01-06 ENCOUNTER — Telehealth (HOSPITAL_COMMUNITY): Payer: Self-pay | Admitting: Adult Health

## 2020-01-06 NOTE — Telephone Encounter (Signed)
Called and LMOM regarding monoclonal antibody treatment for COVID 19 given to those who are at risk for complications and/or hospitalization of the virus.  Patient meets criteria based on: BMI greater than 25  Call back number given: [phone]  My chart message: sent  Samantha Mascio, NP  

## 2020-01-07 ENCOUNTER — Ambulatory Visit (HOSPITAL_COMMUNITY)
Admission: RE | Admit: 2020-01-07 | Discharge: 2020-01-07 | Disposition: A | Discharge status: Home / Self Care | Payer: No Typology Code available for payment source | Source: Ambulatory Visit | Attending: Pulmonary Disease | Admitting: Pulmonary Disease

## 2020-01-07 ENCOUNTER — Other Ambulatory Visit: Payer: Self-pay | Admitting: Nurse Practitioner

## 2020-01-07 DIAGNOSIS — Z6835 Body mass index (BMI) 35.0-35.9, adult: Secondary | ICD-10-CM | POA: Insufficient documentation

## 2020-01-07 DIAGNOSIS — U071 COVID-19: Secondary | ICD-10-CM

## 2020-01-07 DIAGNOSIS — E6609 Other obesity due to excess calories: Secondary | ICD-10-CM | POA: Insufficient documentation

## 2020-01-07 MED ORDER — DIPHENHYDRAMINE HCL 50 MG/ML IJ SOLN: 50.0000 mg | Freq: Once | INTRAMUSCULAR | Status: DC | PRN

## 2020-01-07 MED ORDER — FAMOTIDINE IN NACL 20-0.9 MG/50ML-% IV SOLN: 20.0000 mg | Freq: Once | INTRAVENOUS | Status: DC | PRN

## 2020-01-07 MED ORDER — SODIUM CHLORIDE 0.9 % IV SOLN: INTRAVENOUS | Status: DC | PRN

## 2020-01-07 MED ORDER — SODIUM CHLORIDE 0.9 % IV SOLN
1200.0000 mg | Freq: Once | INTRAVENOUS | Status: AC
  Administered 2020-01-07: 1200 mg via INTRAVENOUS
  Filled 2020-01-07: qty 10

## 2020-01-07 MED ORDER — METHYLPREDNISOLONE SODIUM SUCC 125 MG IJ SOLR: 125.0000 mg | Freq: Once | INTRAMUSCULAR | Status: DC | PRN

## 2020-01-07 MED ORDER — ALBUTEROL SULFATE HFA 108 (90 BASE) MCG/ACT IN AERS: 2.0000 | INHALATION_SPRAY | Freq: Once | RESPIRATORY_TRACT | Status: DC | PRN

## 2020-01-07 MED ORDER — EPINEPHRINE 0.3 MG/0.3ML IJ SOAJ: 0.3000 mg | Freq: Once | INTRAMUSCULAR | Status: DC | PRN

## 2020-01-07 NOTE — Discharge Instructions (Signed)

## 2020-01-07 NOTE — Progress Notes (Signed)
  Diagnosis: COVID-19  Physician: Dr. Wright  Procedure: [facility] Infusion Clinic Med: casirivimab\imdevimab infusion - Provided patient with casirivimab\imdevimab fact sheet for patients, parents and caregivers prior to infusion.  Complications: No immediate complications noted.  Discharge: Discharged home   [name] M Rabecca Birge 01/07/2020  

## 2020-01-07 NOTE — Progress Notes (Signed)
I connected by phone with Samantha Watkins on 01/07/2020 at 10:06 AM to discuss the potential use of a new treatment for mild to moderate COVID-19 viral infection in non-hospitalized patients.  This patient is a 24 y.o. female that meets the FDA criteria for Emergency Use Authorization of COVID monoclonal antibody casirivimab/imdevimab.  Has a (+) direct SARS-CoV-2 viral test result  Has mild or moderate COVID-19   Is NOT hospitalized due to COVID-19  Is within 10 days of symptom onset  Has at least one of the high risk factor(s) for progression to severe COVID-19 and/or hospitalization as defined in EUA.  Specific high risk criteria : BMI > 25   I have spoken and communicated the following to the patient or parent/caregiver regarding COVID monoclonal antibody treatment:  1. FDA has authorized the emergency use for the treatment of mild to moderate COVID-19 in adults and pediatric patients with positive results of direct SARS-CoV-2 viral testing who are 68 years of age and older weighing at least 40 kg, and who are at high risk for progressing to severe COVID-19 and/or hospitalization.  2. The significant known and potential risks and benefits of COVID monoclonal antibody, and the extent to which such potential risks and benefits are unknown.  3. Information on available alternative treatments and the risks and benefits of those alternatives, including clinical trials.  4. Patients treated with COVID monoclonal antibody should continue to self-isolate and use infection control measures (e.g., wear mask, isolate, social distance, avoid sharing personal items, clean and disinfect "high touch" surfaces, and frequent handwashing) according to CDC guidelines.   5. The patient or parent/caregiver has the option to accept or refuse COVID monoclonal antibody treatment.  After reviewing this information with the patient, The patient agreed to proceed with receiving casirivimab\imdevimab  infusion and will be provided a copy of the Fact sheet prior to receiving the infusion. Ivonne Andrew 01/07/2020 10:06 AM

## 2020-07-03 ENCOUNTER — Ambulatory Visit: Payer: 59 | Admitting: Endocrinology

## 2020-08-01 ENCOUNTER — Other Ambulatory Visit: Payer: Self-pay | Admitting: Endocrinology

## 2020-08-01 DIAGNOSIS — Z789 Other specified health status: Secondary | ICD-10-CM

## 2020-08-11 ENCOUNTER — Other Ambulatory Visit: Payer: Self-pay | Admitting: Endocrinology
